# Patient Record
Sex: Female | Born: 1980 | Race: Asian | Hispanic: No | Marital: Married | Smoking: Never smoker
Health system: Southern US, Community
[De-identification: ages and names within clinical notes are randomized; demographics above are authoritative.]

## PROBLEM LIST (undated history)

## (undated) DIAGNOSIS — C50919 Malignant neoplasm of unspecified site of unspecified female breast: Secondary | ICD-10-CM

## (undated) HISTORY — PX: APPENDECTOMY: SHX54

---

## 2013-05-27 ENCOUNTER — Encounter: Payer: Self-pay | Admitting: Emergency Medicine

## 2013-05-27 ENCOUNTER — Emergency Department
Admission: EM | Admit: 2013-05-27 | Discharge: 2013-05-27 | Disposition: A | Discharge status: Home / Self Care | Payer: Self-pay | Source: Home / Self Care | Attending: Family Medicine | Admitting: Family Medicine

## 2013-05-27 DIAGNOSIS — J029 Acute pharyngitis, unspecified: Secondary | ICD-10-CM

## 2013-05-27 LAB — POCT RAPID STREP A (OFFICE): Rapid Strep A Screen: NEGATIVE

## 2013-05-27 MED ORDER — AZITHROMYCIN 250 MG PO TABS: ORAL_TABLET | ORAL | Status: DC

## 2013-05-27 NOTE — ED Notes (Signed)
Pt c/o sore throat and chills x last night. No fever.

## 2013-05-27 NOTE — Discharge Instructions (Signed)
Take plain Mucinex (1200 mg guaifenesin) twice daily for cough and congestion.  May add Sudafed for sinus congestion.   Increase fluid intake, rest. May use Afrin nasal spray (or generic oxymetazoline) twice daily for about 5 days.  Also recommend using saline nasal spray several times daily and saline nasal irrigation (AYR is a common brand) Try warm salt water gargles for sore throat.  May take Delsym Cough Suppressant at bedtime for nighttime cough.  Stop all antihistamines for now, and other non-prescription cough/cold preparations. May take Ibuprofen 200mg , 4 tabs every 8 hours with food for sore throat, headache, etc. Begin Azithromycin if throat culture positive, if not improving about 5 days or if persistent fever develops    Follow-up with family doctor if not improving 7 to 10 days.

## 2013-05-27 NOTE — ED Provider Notes (Signed)
CSN: [id number]     Arrival date & time 05/27/13  1227 History   First MD Initiated Contact with Patient 05/27/13 1339     Chief Complaint  Patient presents with  . Sore Throat      HPI Comments: Patient developed sore throat and chills last night.  The history is provided by the patient.    History reviewed. No pertinent past medical history. Past Surgical History  Procedure Laterality Date  . Appendectomy     Family History  Problem Relation Age of Onset  . Hypertension Mother   . Hypertension Father    History  Substance Use Topics  . Smoking status: Never Smoker   . Smokeless tobacco: Never Used  . Alcohol Use: Yes   OB History   Grav Para Term Preterm Abortions TAB SAB Ect Mult Living                 Review of Systems + sore throat No cough No pleuritic pain No wheezing No nasal congestion ? post-nasal drainage No sinus pain/pressure No itchy/red eyes No earache No hemoptysis No SOB No fever, + chills No nausea No vomiting No abdominal pain No diarrhea No urinary symptoms No skin rash + fatigue + myalgias No headache    Allergies  Review of patient's allergies indicates no known allergies.  Home Medications   Current Outpatient Rx  Name  Route  Sig  Dispense  Refill  . azithromycin (ZITHROMAX Z-PAK) 250 MG tablet      Take 2 tabs today; then begin one tab once daily for 4 more days. (Rx void after 06/04/13)   6 each   0    BP 106/68  Pulse 98  Temp(Src) 98.9 F (37.2 C) (Oral)  Resp 14  Ht 5\' 1"  (1.549 m)  Wt 108 lb (48.988 kg)  BMI 20.42 kg/m2  SpO2 98%  LMP 05/27/2013 Physical Exam Nursing notes and Vital Signs reviewed. Appearance:  Patient appears healthy, stated age, and in no acute distress Eyes:  Pupils are equal, round, and reactive to light and accomodation.  Extraocular movement is intact.  Conjunctivae are not inflamed  Ears:  Canals normal.  Tympanic membranes normal.  Nose:  Mildly congested turbinates.  No sinus  tenderness.  Pharynx:  Minimal erythema Neck:  Supple.   Prominent bilateral non-tender anterior nodes  Lungs:  Clear to auscultation.  Breath sounds are equal.  Heart:  Regular rate and rhythm without murmurs, rubs, or gallops.  Abdomen:  Nontender without masses or hepatosplenomegaly.  Bowel sounds are present.  No CVA or flank tenderness.  Extremities:  No edema.    Skin:  No rash present.   ED Course  Procedures  None    Labs Reviewed  STREP A DNA PROBE  POCT RAPID STREP A (OFFICE) negative         MDM   1. Acute pharyngitis; suspect early viral URI    Throat culture pending.  There is no evidence of bacterial infection today.   Take plain Mucinex (1200 mg guaifenesin) twice daily for cough and congestion.  May add Sudafed for sinus congestion.   Increase fluid intake, rest. May use Afrin nasal spray (or generic oxymetazoline) twice daily for about 5 days.  Also recommend using saline nasal spray several times daily and saline nasal irrigation (AYR is a common brand) Try warm salt water gargles for sore throat.  May take Delsym Cough Suppressant at bedtime for nighttime cough.  Stop all antihistamines for now,  and other non-prescription cough/cold preparations. May take Ibuprofen 200mg , 4 tabs every 8 hours with food for sore throat, headache, etc. Begin Azithromycin if throat culture positive, if not improving about 5 days or if persistent fever develops (Given a prescription to hold, with an expiration date)  Follow-up with family doctor if not improving 7 to 10 days.    Lattie HawStephen A Eliodoro Gullett, MD 05/29/13 1115

## 2013-05-28 LAB — STREP A DNA PROBE: GASP: NEGATIVE

## 2013-06-01 ENCOUNTER — Telehealth: Payer: Self-pay | Admitting: *Deleted

## 2013-08-11 ENCOUNTER — Ambulatory Visit (INDEPENDENT_AMBULATORY_CARE_PROVIDER_SITE_OTHER): Payer: No Typology Code available for payment source | Admitting: Sports Medicine

## 2013-08-11 ENCOUNTER — Encounter: Payer: Self-pay | Admitting: Sports Medicine

## 2013-08-11 VITALS — BP 103/62 | HR 75 | Ht 62.0 in | Wt 109.0 lb

## 2013-08-11 DIAGNOSIS — Z299 Encounter for prophylactic measures, unspecified: Secondary | ICD-10-CM | POA: Insufficient documentation

## 2013-08-11 DIAGNOSIS — Z0000 Encounter for general adult medical examination without abnormal findings: Secondary | ICD-10-CM

## 2013-08-11 DIAGNOSIS — R718 Other abnormality of red blood cells: Secondary | ICD-10-CM

## 2013-08-11 DIAGNOSIS — Z3009 Encounter for other general counseling and advice on contraception: Secondary | ICD-10-CM

## 2013-08-11 NOTE — Progress Notes (Signed)
  Subjective:    CC: Establish care, complete physical.   HPI:  This is a very pleasant 33 year old female, she is healthy, no medical problems and no complaints. She does desire complete physical. She [facility], she desires to repeat, but she would like to be able to choose the sex of the baby. She has selected a reproductive endocrinologist in the [place] area. She is up-to-date on all screening measures, her last Pap smear was in 2014, she has had greater than 3 negative.  Past medical history, Surgical history, Family history not pertinant except as noted below, Social history, Allergies, and medications have been entered into the medical record, reviewed, and no changes needed.   Review of Systems: No headache, visual changes, nausea, vomiting, diarrhea, constipation, dizziness, abdominal pain, skin rash, fevers, chills, night sweats, swollen lymph nodes, weight loss, chest pain, body aches, joint swelling, muscle aches, shortness of breath, mood changes, visual or auditory hallucinations.  Objective:    General: Well Developed, well nourished, and in no acute distress.  Neuro: Alert and oriented x3, extra-ocular muscles intact, sensation grossly intact.  HEENT: Normocephalic, atraumatic, pupils equal round reactive to light, neck supple, no masses, no lymphadenopathy, thyroid nonpalpable. Oropharynx, nasopharynx, ear canals are remarkable.  Skin: Warm and dry, no rashes noted.  Cardiac: Regular rate and rhythm, no murmurs rubs or gallops.  Respiratory: Clear to auscultation bilaterally. Not using accessory muscles, speaking in full sentences.  Abdominal: Soft, nontender, nondistended, positive bowel sounds, no masses, no organomegaly.  Musculoskeletal: Shoulder, elbow, wrist, hip, knee, ankle stable, and with full range of motion.  Impression and Recommendations:    The patient was counselled, risk factors were discussed,  anticipatory guidance given.

## 2013-08-11 NOTE — Assessment & Plan Note (Signed)
Patient does desire to be able to choose the sex of her baby, she asks for fertility medicine referral. I did discuss this with our local OB/GYN, there are no doctors in the area that allow this.

## 2013-08-11 NOTE — Assessment & Plan Note (Signed)
Complete physical as above. Pap smear was [facility] in [place]. She has had greater than 3 negatives.

## 2013-08-26 DIAGNOSIS — R718 Other abnormality of red blood cells: Secondary | ICD-10-CM | POA: Insufficient documentation

## 2013-08-26 LAB — COMPREHENSIVE METABOLIC PANEL
ALT: 13 U/L (ref 0–35)
AST: 15 U/L (ref 0–37)
Alkaline Phosphatase: 50 U/L (ref 39–117)
CO2: 25 mEq/L (ref 19–32)
Calcium: 8.9 mg/dL (ref 8.4–10.5)
Creat: 0.53 mg/dL (ref 0.50–1.10)
Total Bilirubin: 0.8 mg/dL (ref 0.2–1.2)

## 2013-08-26 LAB — LIPID PANEL
Cholesterol: 153 mg/dL (ref 0–200)
HDL: 44 mg/dL (ref >39)
LDL Cholesterol: 96 mg/dL (ref 0–99)
Total CHOL/HDL Ratio: 3.5 Ratio
Triglycerides: 64 mg/dL (ref <150)
VLDL: 13 mg/dL (ref 0–40)

## 2013-08-26 LAB — COMPREHENSIVE METABOLIC PANEL WITH GFR
Albumin: 4.1 g/dL (ref 3.5–5.2)
BUN: 13 mg/dL (ref 6–23)
Chloride: 105 meq/L (ref 96–112)
Glucose, Bld: 76 mg/dL (ref 70–99)
Potassium: 3.8 meq/L (ref 3.5–5.3)
Sodium: 139 meq/L (ref 135–145)
Total Protein: 6.5 g/dL (ref 6.0–8.3)

## 2013-08-26 LAB — CBC
HCT: 40.7 % (ref 36.0–46.0)
Hemoglobin: 12.9 g/dL (ref 12.0–15.0)
MCH: 21.7 pg — ABNORMAL LOW (ref 26.0–34.0)
MCHC: 31.7 g/dL (ref 30.0–36.0)
MCV: 68.5 fL — ABNORMAL LOW (ref 78.0–100.0)
Platelets: 216 10*3/uL (ref 150–400)
RBC: 5.94 MIL/uL — ABNORMAL HIGH (ref 3.87–5.11)
RDW: 16.6 % — ABNORMAL HIGH (ref 11.5–15.5)
WBC: 4.2 K/uL (ref 4.0–10.5)

## 2013-08-26 LAB — TSH: TSH: 2.036 u[IU]/mL (ref 0.350–4.500)

## 2013-08-26 LAB — HEMOGLOBIN A1C
Hgb A1c MFr Bld: 5.7 % — ABNORMAL HIGH (ref <5.7)
Mean Plasma Glucose: 117 mg/dL — ABNORMAL HIGH (ref <117)

## 2013-08-26 NOTE — Assessment & Plan Note (Signed)
Microcytosis in an [place] female. This likely represents a thalassemia, checking hemoglobin electrophoresis and an anemia panel.

## 2013-08-26 NOTE — Addendum Note (Signed)
Addended by: Monica BectonHEKKEKANDAM, Treena Cosman J on: 08/26/2013 08:43 AM   Modules accepted: Orders

## 2013-08-29 LAB — IRON AND TIBC
%SAT: 41 % (ref 20–55)
Iron: 150 ug/dL — ABNORMAL HIGH (ref 42–145)
TIBC: 370 ug/dL (ref 250–470)
UIBC: 220 ug/dL (ref 125–400)

## 2013-08-29 LAB — FOLATE: Folate: >20 ng/mL

## 2013-08-29 LAB — VITAMIN B12: Vitamin B-12: 608 pg/mL (ref 211–911)

## 2013-08-29 LAB — FERRITIN: Ferritin: 10 ng/mL (ref 10–291)

## 2013-08-31 LAB — HEMOGLOBINOPATHY EVALUATION
Hemoglobin Other: 0 %
Hgb A2 Quant: 2.2 % (ref 2.2–3.2)
Hgb A: 97.8 % (ref 96.8–97.8)
Hgb F Quant: 0 % (ref 0.0–2.0)
Hgb S Quant: 0 %

## 2013-10-11 ENCOUNTER — Telehealth: Payer: Self-pay

## 2013-10-11 DIAGNOSIS — M26629 Arthralgia of temporomandibular joint, unspecified side: Secondary | ICD-10-CM

## 2013-10-11 NOTE — Telephone Encounter (Signed)
Referral placed.

## 2013-10-11 NOTE — Telephone Encounter (Signed)
Patient has been informed. Jarelly Rinck,CMA  

## 2013-10-11 NOTE — Telephone Encounter (Signed)
Patient called stated that she needs a referral to see the oral surgeon for her jaw. Rhonda Cunningham,CMA

## 2015-02-07 ENCOUNTER — Ambulatory Visit (INDEPENDENT_AMBULATORY_CARE_PROVIDER_SITE_OTHER): Payer: No Typology Code available for payment source | Admitting: Physician Assistant

## 2015-02-07 ENCOUNTER — Encounter: Payer: Self-pay | Admitting: Physician Assistant

## 2015-02-07 VITALS — BP 120/69 | HR 70 | Ht 62.0 in | Wt 107.0 lb

## 2015-02-07 DIAGNOSIS — K2970 Gastritis, unspecified, without bleeding: Secondary | ICD-10-CM

## 2015-02-07 DIAGNOSIS — K2990 Gastroduodenitis, unspecified, without bleeding: Secondary | ICD-10-CM

## 2015-02-07 MED ORDER — RANITIDINE HCL 150 MG PO TABS: 150.0000 mg | ORAL_TABLET | Freq: Two times a day (BID) | ORAL | Status: DC

## 2015-02-07 MED ORDER — GI COCKTAIL ~~LOC~~
30.0000 mL | Freq: Once | ORAL | Status: AC
  Administered 2015-02-07: 30 mL via ORAL

## 2015-02-07 NOTE — Patient Instructions (Signed)

## 2015-02-07 NOTE — Progress Notes (Signed)
Alexis [name] is a 34 y.o. female who presents to [facility] Primary Care today for Epigastric pain. Pain started on Monday and is located in the medial epigastric region. Pain is persistent throughout the day, and is affecting her sleep at night. She describes the pain as "pressure" and feeling like "there's a lot of gas in there", but not sharp in nature. Pain radiates to chest, and she is not experiencing symptoms in any other location. She rates pain as 3/10. Epigastric pain was preceded by headache Sunday night, and a singular episode of diarrhea on Monday. She reports the headache resolved that she returned to regular bowel habits. She has taken ibuprofen and tums with no relief, there are no palliative factors. She denies fever, chills, myalgias, chest pain, cough, and sob.   No past medical history on file. Past Surgical History  Procedure Laterality Date  . Appendectomy     Social History  Substance Use Topics  . Smoking status: Never Smoker   . Smokeless tobacco: Never Used  . Alcohol Use: Yes   family history includes Hypertension in her father and mother.  ROS as above Medications: OTC Ibuprofen and tums prn  No current outpatient prescriptions on file.   No current facility-administered medications for this visit.   No Known Allergies   Exam:  Vitals: Blood pressure 120/69, pulse 70, height  (1.575 m), weight 107 lb (48.535 kg). Gen: Well NAD HEENT: EOMI,  MMM Lungs: Normal work of breathing. CTABL Heart: RRR no MRG Abd: NABS, Soft. Nondistended, Nontender   Assessment and Plan:  Gastritis/Epigastric pain: Gave patient GI cocktail in office today. Rx 150 mg ranitidine qd, encouraged patient to try for 7 days and see if symptoms improve. Patient instructed to call if symptoms worsen or do not improve after 7 days. GERD diet discussed. Stay away from NSAIDs.   Reviewed and changes made by Tandy Gaw PA-C.

## 2015-03-09 ENCOUNTER — Encounter: Payer: Self-pay | Admitting: Family Medicine

## 2015-03-09 ENCOUNTER — Ambulatory Visit (INDEPENDENT_AMBULATORY_CARE_PROVIDER_SITE_OTHER): Payer: Self-pay | Admitting: Family Medicine

## 2015-03-09 VITALS — BP 100/69 | HR 73 | Temp 98.4°F | Resp 16 | Wt 108.3 lb

## 2015-03-09 DIAGNOSIS — J069 Acute upper respiratory infection, unspecified: Secondary | ICD-10-CM

## 2015-03-09 MED ORDER — BENZONATATE 200 MG PO CAPS: 200.0000 mg | ORAL_CAPSULE | Freq: Three times a day (TID) | ORAL | Status: DC | PRN

## 2015-03-09 NOTE — Patient Instructions (Signed)

## 2015-03-09 NOTE — Progress Notes (Addendum)
   Subjective:    Patient ID: Alexis [name], female    DOB: 01-22-1981, 34 y.o.   MRN: [id number]  HPI Sore throat and cough 2 days. She's been taking Mucinex for relief, but not really helping. No fever, chills or sweats.  She feels like her throat is a little bit swollen. No sinus symptoms such as congestion. Her daughter has been sick as well.   Review of Systems     Objective:   Physical Exam  Constitutional: She is oriented to person, place, and time. She appears well-developed and well-nourished.  HENT:  Head: Normocephalic and atraumatic.  Right Ear: External ear normal.  Left Ear: External ear normal.  Nose: Nose normal.  Mouth/Throat: Oropharynx is clear and moist.  TMs and canals are clear.   Eyes: Conjunctivae and EOM are normal. Pupils are equal, round, and reactive to light.  Neck: Neck supple. No thyromegaly present.  Cardiovascular: Normal rate, regular rhythm and normal heart sounds.   Pulmonary/Chest: Effort normal and breath sounds normal. She has no wheezes.  Lymphadenopathy:    She has no cervical adenopathy.  Neurological: She is alert and oriented to person, place, and time.  Skin: Skin is warm and dry.  Psychiatric: She has a normal mood and affect.          Assessment & Plan:  Upper respiratory infection-most likely viral.  Recommend symptomatic care. [facility]. Recommend over-the-counter zinc lozenges. Call back if not better in one week or if getting worse please call back sooner.

## 2015-03-12 ENCOUNTER — Telehealth: Payer: Self-pay

## 2015-03-12 NOTE — Telephone Encounter (Signed)
Patient called and states the cough medication is not helping and she would like something else for the cough. Please advise.

## 2015-03-12 NOTE — Telephone Encounter (Signed)
OK to print script.  Med entered. She will have to pick it up. If more SOB or getting worse then can get CXR.

## 2015-03-13 MED ORDER — HYDROCODONE-HOMATROPINE 5-1.5 MG/5ML PO SYRP: 5.0000 mL | ORAL_SOLUTION | Freq: Every evening | ORAL | Status: DC | PRN

## 2015-03-13 NOTE — Telephone Encounter (Signed)
Patient advised.

## 2015-04-04 ENCOUNTER — Ambulatory Visit (INDEPENDENT_AMBULATORY_CARE_PROVIDER_SITE_OTHER): Payer: Self-pay | Admitting: Sports Medicine

## 2015-04-04 ENCOUNTER — Other Ambulatory Visit: Payer: Self-pay | Admitting: Sports Medicine

## 2015-04-04 ENCOUNTER — Ambulatory Visit (INDEPENDENT_AMBULATORY_CARE_PROVIDER_SITE_OTHER): Payer: Self-pay

## 2015-04-04 VITALS — BP 97/80 | HR 98 | Temp 98.0°F | Resp 18 | Wt 108.7 lb

## 2015-04-04 DIAGNOSIS — R05 Cough: Secondary | ICD-10-CM

## 2015-04-04 DIAGNOSIS — R053 Chronic cough: Secondary | ICD-10-CM | POA: Insufficient documentation

## 2015-04-04 LAB — CBC WITH DIFFERENTIAL/PLATELET
Basophils Absolute: 0 K/uL (ref 0.0–0.1)
Basophils Relative: 0 % (ref 0–1)
Eosinophils Absolute: 0.1 10*3/uL (ref 0.0–0.7)
Eosinophils Relative: 2 % (ref 0–5)
HCT: 37.6 % (ref 36.0–46.0)
Hemoglobin: 12.3 g/dL (ref 12.0–15.0)
Lymphocytes Relative: 14 % (ref 12–46)
Lymphs Abs: 1 10*3/uL (ref 0.7–4.0)
MCH: 22 pg — ABNORMAL LOW (ref 26.0–34.0)
MCHC: 32.7 g/dL (ref 30.0–36.0)
MCV: 67.3 fL — ABNORMAL LOW (ref 78.0–100.0)
Monocytes Absolute: 0.5 K/uL (ref 0.1–1.0)
Monocytes Relative: 7 % (ref 3–12)
Neutro Abs: 5.6 K/uL (ref 1.7–7.7)
Neutrophils Relative %: 77 % (ref 43–77)
Platelets: 206 10*3/uL (ref 150–400)
RBC: 5.59 MIL/uL — ABNORMAL HIGH (ref 3.87–5.11)
RDW: 15.3 % (ref 11.5–15.5)
WBC: 7.3 K/uL (ref 4.0–10.5)

## 2015-04-04 LAB — COMPREHENSIVE METABOLIC PANEL WITH GFR
AST: 13 U/L (ref 10–30)
Albumin: 4.2 g/dL (ref 3.6–5.1)
CO2: 24 mmol/L (ref 20–31)
Creat: 0.51 mg/dL (ref 0.50–1.10)
Sodium: 135 mmol/L (ref 135–146)

## 2015-04-04 LAB — D-DIMER, QUANTITATIVE: D-Dimer, Quant: 0.27 ug{FEU}/mL (ref 0.00–0.48)

## 2015-04-04 LAB — COMPREHENSIVE METABOLIC PANEL
ALT: 10 U/L (ref 6–29)
Alkaline Phosphatase: 61 U/L (ref 33–115)
BUN: 11 mg/dL (ref 7–25)
Calcium: 9.1 mg/dL (ref 8.6–10.2)
Chloride: 102 mmol/L (ref 98–110)
Glucose, Bld: 80 mg/dL (ref 65–99)
Potassium: 3.8 mmol/L (ref 3.5–5.3)
Total Bilirubin: 0.7 mg/dL (ref 0.2–1.2)
Total Protein: 6.6 g/dL (ref 6.1–8.1)

## 2015-04-04 MED ORDER — AZITHROMYCIN 250 MG PO TABS: ORAL_TABLET | ORAL | Status: DC

## 2015-04-04 MED ORDER — HYDROCOD POLST-CPM POLST ER 10-8 MG/5ML PO SUER: 5.0000 mL | Freq: Two times a day (BID) | ORAL | Status: DC | PRN

## 2015-04-04 NOTE — Progress Notes (Signed)
  Subjective:    CC: Persistent cough  HPI: This is a pleasant 34 year old female, for a month and a half now she's had a persistent cough, after exposure to illness from her child, cough is productive of sputum, nonbloody, she denies any constitutional symptoms, no weight loss, fevers, chills, night sweats. No travel outside of the country, no GI symptoms, no rash. No shortness of breath.  Past medical history, Surgical history, Family history not pertinant except as noted below, Social history, Allergies, and medications have been entered into the medical record, reviewed, and no changes needed.   Review of Systems: No fevers, chills, night sweats, weight loss, chest pain, or shortness of breath.   Objective:    General: Well Developed, well nourished, and in no acute distress.  Neuro: Alert and oriented x3, extra-ocular muscles intact, sensation grossly intact.  HEENT: Normocephalic, atraumatic, pupils equal round reactive to light, neck supple, no masses, no lymphadenopathy, thyroid nonpalpable. Oropharynx, nasopharynx, ear canals unremarkable. Skin: Warm and dry, no rashes. Cardiac: Regular rate and rhythm, no murmurs rubs or gallops, no lower extremity edema.  Respiratory: Clear to auscultation bilaterally. Not using accessory muscles, speaking in full sentences.  Chest x-ray is unremarkable.  Impression and Recommendations:

## 2015-04-04 NOTE — Assessment & Plan Note (Signed)
Adding antibiotics, Tussionex, chest x-ray, and blood test for pertussis and tuberculosis. Return to see me in one week.

## 2015-04-05 LAB — ANGIOTENSIN CONVERTING ENZYME: Angiotensin-Converting Enzyme: 28 U/L (ref 8–52)

## 2015-04-05 LAB — C-ANCA TITER: C-ANCA: 1:40 {titer} — ABNORMAL HIGH

## 2015-04-05 LAB — ANA: Anti Nuclear Antibody(ANA): NEGATIVE

## 2015-04-05 LAB — ANCA SCREEN W REFLEX TITER: ANCA Screen: POSITIVE — AB

## 2015-04-06 LAB — QUANTIFERON TB GOLD ASSAY (BLOOD)
Interferon Gamma Release Assay: NEGATIVE
Mitogen value: >10 [IU]/mL
Quantiferon Nil Value: 0.05 [IU]/mL
Quantiferon Tb Ag Minus Nil Value: 0 IU/mL
TB Ag value: 0.05 IU/mL

## 2015-04-10 LAB — BORDETELLA PERTUSSIS AB IGG,IGA
FHA IgA: 13 IU/mL
FHA IgG: 62 IU/mL
PT IgA: <1 IU/mL
PT IgG: 2 IU/mL

## 2015-04-18 ENCOUNTER — Ambulatory Visit: Payer: Self-pay | Admitting: Sports Medicine

## 2015-04-19 ENCOUNTER — Ambulatory Visit (INDEPENDENT_AMBULATORY_CARE_PROVIDER_SITE_OTHER): Payer: Self-pay | Admitting: Sports Medicine

## 2015-04-19 ENCOUNTER — Encounter: Payer: Self-pay | Admitting: Sports Medicine

## 2015-04-19 VITALS — BP 100/51 | HR 75 | Wt 108.0 lb

## 2015-04-19 DIAGNOSIS — R05 Cough: Secondary | ICD-10-CM

## 2015-04-19 DIAGNOSIS — R718 Other abnormality of red blood cells: Secondary | ICD-10-CM

## 2015-04-19 DIAGNOSIS — R053 Chronic cough: Secondary | ICD-10-CM

## 2015-04-19 DIAGNOSIS — Z23 Encounter for immunization: Secondary | ICD-10-CM

## 2015-04-19 MED ORDER — FERROUS SULFATE 325 (65 FE) MG PO TBEC: 325.0000 mg | DELAYED_RELEASE_TABLET | Freq: Two times a day (BID) | ORAL | Status: DC

## 2015-04-19 NOTE — Assessment & Plan Note (Signed)
Cough continues to improve. At this point she is happy with where things are, discontinue Tussionex. Imaging and serum workup were negative and antibiotics seemed to improve her symptoms sufficiently.

## 2015-04-19 NOTE — Progress Notes (Signed)
  Subjective:    CC: Cough  HPI: Patient returns after visit 2 weeks ago for a cough of 6 weeks duration at that time. She is doing better after taking the antibiotic and anti-tussive medications. She says that the cough has decreased in frequency and she is no longer producing any green phlegm. She thinks the cough is worse at night because that is when her family complains the most about it. She says that the cough is mainly a nuisance because it keeps her family up at night.  She reports that she usually gets a cough that lasts 1-2 months after URI and that the only thing that made this episode different was increased phlegm production. She denies hemoptysis, SOB, chest pain, weight loss, chills, or fevers. She reports feeling "great" otherwise.  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  Patient also complains of "dizziness". She says she feels like she may vomit, mainly when she is in a car. She denies feeling like the room is spinning or like she may pass out. This has been going on for years. She asks if this is related to her anemia.  Past medical history, Surgical history, Family history not pertinant except as noted below, Social history, Allergies, and medications have been entered into the medical record, reviewed, and no changes needed.   Review of Systems: No fevers, chills, night sweats, weight loss, chest pain, or shortness of breath.   Objective:    General: Well Developed, well nourished, and in no acute distress.  Neuro: Alert and oriented x3, extra-ocular muscles intact, sensation grossly intact.  HEENT: Normocephalic, atraumatic, pupils equal round reactive to light, neck supple, no masses, no lymphadenopathy, thyroid nonpalpable.  Skin: Warm and dry, no rashes. Cardiac: Regular rate and rhythm, no murmurs rubs or gallops, no lower extremity edema.  Respiratory: Clear to auscultation  bilaterally. Not using accessory muscles, speaking in full sentences.   Impression and Recommendations:    Patient had an extensive work up for chronic cough that came back normal (TB, ANA, d-dimer, CBC, BMP, Bordatella, and angiotensin converting enzyme). Patient did have a slightly elevated c-ANCA, but the mild elevation in conjunction with lack of hemoptysis and systemic symptoms make an autoimmune disease unlikely at this time. As the patient is improving, we will stay the course with anti-tussives for symptom management. Most likely patient had bacterial bronchitis that improved with antibiotic therapy. Patient told to come back if symptoms worsen or fail to improve.  Patient's dizziness could be related to iron deficiency given her past history of a low ferritin and microcytosis. Will give iron PO Rx and plan to recheck labs(anemia panel) upon next visit.

## 2015-04-19 NOTE — Assessment & Plan Note (Signed)
Persistent microcytosis without anemia. Anemia panel was negative over ferritin was in the low limit of normal at 10. Hemoglobin electrophoresis was negative. Adding iron supplementation.

## 2015-07-10 ENCOUNTER — Ambulatory Visit (INDEPENDENT_AMBULATORY_CARE_PROVIDER_SITE_OTHER): Payer: Self-pay | Admitting: Sports Medicine

## 2015-07-10 DIAGNOSIS — Z23 Encounter for immunization: Secondary | ICD-10-CM

## 2015-07-10 NOTE — Progress Notes (Signed)
Pt's BP was low today, looking back at history not far from her trending BP. Pt does report she has not had breakfast today but is not symptomatic.

## 2015-07-23 ENCOUNTER — Ambulatory Visit: Payer: Self-pay | Admitting: Sports Medicine

## 2015-11-09 ENCOUNTER — Emergency Department
Admission: EM | Admit: 2015-11-09 | Discharge: 2015-11-09 | Disposition: A | Discharge status: Home / Self Care | Payer: Self-pay | Source: Home / Self Care

## 2015-11-09 ENCOUNTER — Ambulatory Visit (INDEPENDENT_AMBULATORY_CARE_PROVIDER_SITE_OTHER): Payer: Self-pay | Admitting: Family Medicine

## 2015-11-09 ENCOUNTER — Encounter: Payer: Self-pay | Admitting: Family Medicine

## 2015-11-09 ENCOUNTER — Encounter: Payer: Self-pay | Admitting: Emergency Medicine

## 2015-11-09 DIAGNOSIS — Z0289 Encounter for other administrative examinations: Secondary | ICD-10-CM

## 2015-11-09 DIAGNOSIS — R05 Cough: Secondary | ICD-10-CM

## 2015-11-09 DIAGNOSIS — R053 Chronic cough: Secondary | ICD-10-CM

## 2015-11-09 MED ORDER — TRIAMCINOLONE ACETONIDE 40 MG/ML IJ SUSP
40.0000 mg | Freq: Once | INTRAMUSCULAR | Status: AC
  Administered 2015-11-09: 40 mg via INTRAMUSCULAR

## 2015-11-09 NOTE — ED Provider Notes (Signed)
CSN: [id number]     Arrival date & time 11/09/15  1130 History   None    Chief Complaint  Patient presents with  . Cough     HPI Comments: Patient reports that three weeks ago she developed typical cold-like symptoms developing over several days,  including mild sore throat, sinus congestion, headache, fatigue, chills, and cough.  She now feels well but cough persists.  No fevers, chills, and sweats recently.  She states that every time she gets a cold, he cough lingers. Her family history includes asthma in her paternal grandfather.  She states that her father has a similar problem with viral URI's.  The history is provided by the patient.    History reviewed. No pertinent past medical history. Past Surgical History  Procedure Laterality Date  . Appendectomy     Family History  Problem Relation Age of Onset  . Hypertension Mother   . Hypertension Father    Social History  Substance Use Topics  . Smoking status: Never Smoker   . Smokeless tobacco: Never Used  . Alcohol Use: Yes   OB History    No data available     Review of Systems + sore throat + cough No pleuritic pain No wheezing No nasal congestion ? post-nasal drainage No sinus pain/pressure No itchy/red eyes No earache No hemoptysis No SOB No fever/chills No nausea No vomiting No abdominal pain No diarrhea No urinary symptoms No skin rash No fatigue No myalgias No headache Used OTC meds without relief  Allergies  Review of patient's allergies indicates no known allergies.  Home Medications   Prior to Admission medications   Medication Sig Start Date End Date Taking? Authorizing Provider  ferrous sulfate 325 (65 FE) MG EC tablet Take 1 tablet (325 mg total) by mouth 2 (two) times daily with a meal. 04/19/15   Monica Bectonhomas J Thekkekandam, MD   Meds Ordered and Administered this Visit   Medications  triamcinolone acetonide (KENALOG-40) injection 40 mg (not administered)    BP 103/67 mmHg  Pulse 81   Temp(Src) 98.2 F (36.8 C) (Oral)  Wt 111 lb (50.349 kg)  SpO2 98% No data found.   Physical Exam Nursing notes and Vital Signs reviewed. Appearance:  Patient appears stated age, and in no acute distress Eyes:  Pupils are equal, round, and reactive to light and accomodation.  Extraocular movement is intact.  Conjunctivae are not inflamed  Ears:  Canals normal.  Tympanic membranes normal.  Nose:  Mildly congested turbinates.  No sinus tenderness.    Pharynx:  Normal Neck:  Supple.  Tender enlarged posterior/lateral nodes are palpated bilaterally  Lungs:  Clear to auscultation.  Breath sounds are equal.  Moving air well. Heart:  Regular rate and rhythm without murmurs, rubs, or gallops.  Abdomen:  Nontender without masses or hepatosplenomegaly.  Bowel sounds are present.  No CVA or flank tenderness.  Extremities:  No edema.  Skin:  No rash present.   ED Course  Procedures none    MDM   1. Persistent cough for 3 weeks or longer; suspect viral post-infectious cough with bronchospasm (note family history of asthma)   There is no evidence of bacterial infection today.   Administered Kenalog 40mg  IM May take Delsym Cough Suppressant at bedtime for nighttime cough.     Lattie HawStephen A Beese, MD 11/09/15 [phone]

## 2015-11-09 NOTE — Progress Notes (Signed)
No show same day appointment 11/09/2015

## 2015-11-09 NOTE — ED Notes (Signed)
Pt c/o non productive cough x3 weeks.

## 2015-11-09 NOTE — Discharge Instructions (Signed)
May take Delsym Cough Suppressant at bedtime for nighttime cough.  

## 2020-04-17 ENCOUNTER — Other Ambulatory Visit: Payer: Self-pay | Admitting: Internal Medicine

## 2020-04-17 DIAGNOSIS — Z1231 Encounter for screening mammogram for malignant neoplasm of breast: Secondary | ICD-10-CM

## 2020-04-23 ENCOUNTER — Other Ambulatory Visit: Payer: Self-pay | Admitting: Obstetrics and Gynecology

## 2020-04-23 DIAGNOSIS — N6452 Nipple discharge: Secondary | ICD-10-CM

## 2020-05-30 ENCOUNTER — Ambulatory Visit: Payer: Self-pay

## 2020-05-31 ENCOUNTER — Inpatient Hospital Stay: Admission: RE | Admit: 2020-05-31 | Payer: Self-pay | Source: Ambulatory Visit

## 2021-01-16 ENCOUNTER — Other Ambulatory Visit: Payer: Self-pay | Admitting: Obstetrics and Gynecology

## 2021-01-16 DIAGNOSIS — N6452 Nipple discharge: Secondary | ICD-10-CM

## 2021-02-27 ENCOUNTER — Other Ambulatory Visit: Payer: Self-pay

## 2021-02-28 ENCOUNTER — Ambulatory Visit
Admission: RE | Admit: 2021-02-28 | Discharge: 2021-02-28 | Disposition: A | Discharge status: Home / Self Care | Payer: BC Managed Care – PPO | Source: Ambulatory Visit | Attending: Obstetrics and Gynecology | Admitting: Obstetrics and Gynecology

## 2021-02-28 ENCOUNTER — Other Ambulatory Visit: Payer: Self-pay

## 2021-02-28 ENCOUNTER — Ambulatory Visit
Admission: RE | Admit: 2021-02-28 | Discharge: 2021-02-28 | Disposition: A | Discharge status: Home / Self Care | Payer: Self-pay | Source: Ambulatory Visit | Attending: Obstetrics and Gynecology | Admitting: Obstetrics and Gynecology

## 2021-02-28 DIAGNOSIS — N6452 Nipple discharge: Secondary | ICD-10-CM

## 2021-03-19 ENCOUNTER — Other Ambulatory Visit: Payer: Self-pay | Admitting: Surgery

## 2021-03-19 ENCOUNTER — Other Ambulatory Visit (HOSPITAL_COMMUNITY): Payer: Self-pay | Admitting: Surgery

## 2021-03-19 DIAGNOSIS — N6452 Nipple discharge: Secondary | ICD-10-CM

## 2021-05-22 ENCOUNTER — Encounter (HOSPITAL_COMMUNITY): Payer: Self-pay

## 2021-05-22 ENCOUNTER — Other Ambulatory Visit: Payer: Self-pay

## 2021-05-22 ENCOUNTER — Ambulatory Visit (HOSPITAL_COMMUNITY)
Admission: RE | Admit: 2021-05-22 | Discharge: 2021-05-22 | Disposition: A | Discharge status: Home / Self Care | Payer: BC Managed Care – PPO | Source: Ambulatory Visit | Attending: Surgery | Admitting: Surgery

## 2021-05-22 DIAGNOSIS — N6452 Nipple discharge: Secondary | ICD-10-CM

## 2021-11-05 ENCOUNTER — Other Ambulatory Visit: Payer: Self-pay | Admitting: Surgery

## 2021-11-05 DIAGNOSIS — Z09 Encounter for follow-up examination after completed treatment for conditions other than malignant neoplasm: Secondary | ICD-10-CM

## 2021-11-27 ENCOUNTER — Other Ambulatory Visit: Payer: Self-pay | Admitting: Surgery

## 2021-11-27 DIAGNOSIS — N6452 Nipple discharge: Secondary | ICD-10-CM

## 2021-11-29 IMAGING — MG MM DIGITAL DIAGNOSTIC UNILAT*L* W/ TOMO W/ CAD
8 series · 8 of 24 positions shown · non-contrast
Comparison: Previous exam(s).
COMPARISON: Previous exam(s).

Addendum:
CLINICAL DATA: 40-year-old female presenting for evaluation of
bloody left nipple discharge. Patient states she has had 2 episodes
of spontaneous bloody left nipple discharge, 1 of which was
approximately 1 year ago.

EXAM:
DIGITAL DIAGNOSTIC UNILATERAL LEFT MAMMOGRAM WITH TOMOSYNTHESIS AND
CAD; ULTRASOUND LEFT BREAST LIMITED
TECHNIQUE: Left digital diagnostic mammography and breast tomosynthesis was
performed. The images were evaluated with computer-aided detection.;
Targeted ultrasound examination of the left breast was performed.

[L MLO synth-2D (1 of 3)]
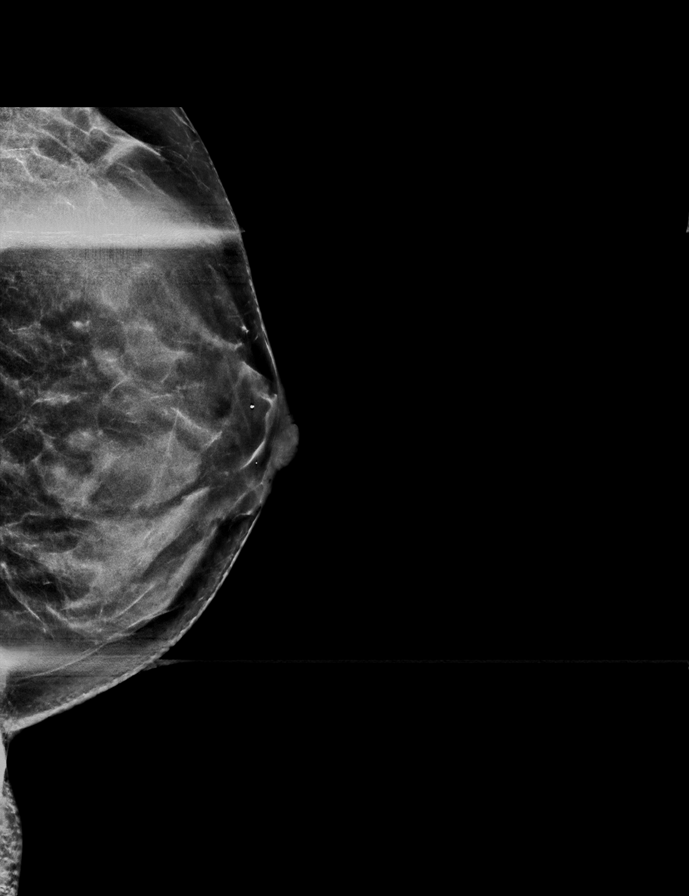

[L MLO synth-2D (2 of 3)]
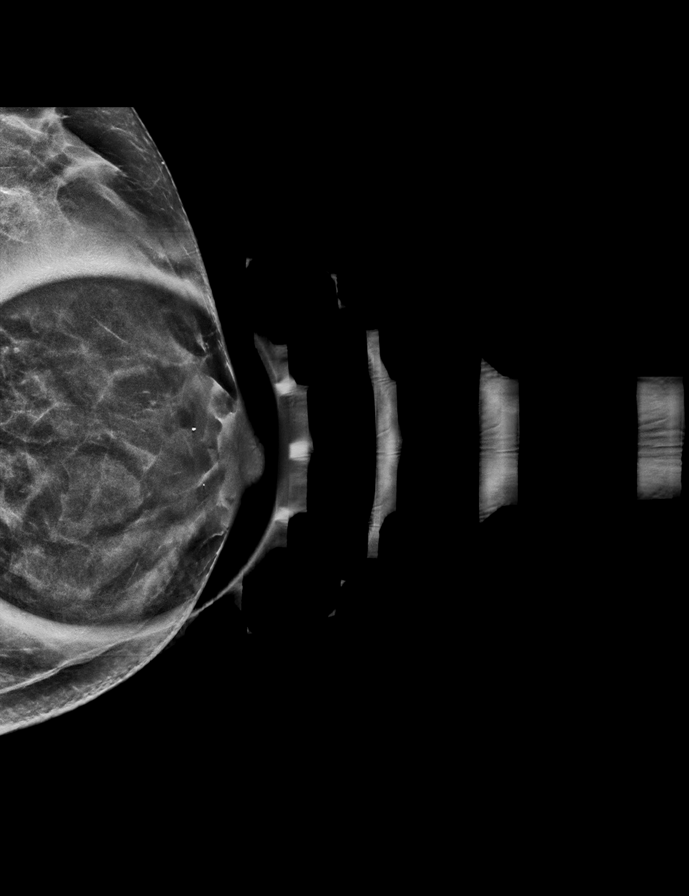

[L CC synth-2D]
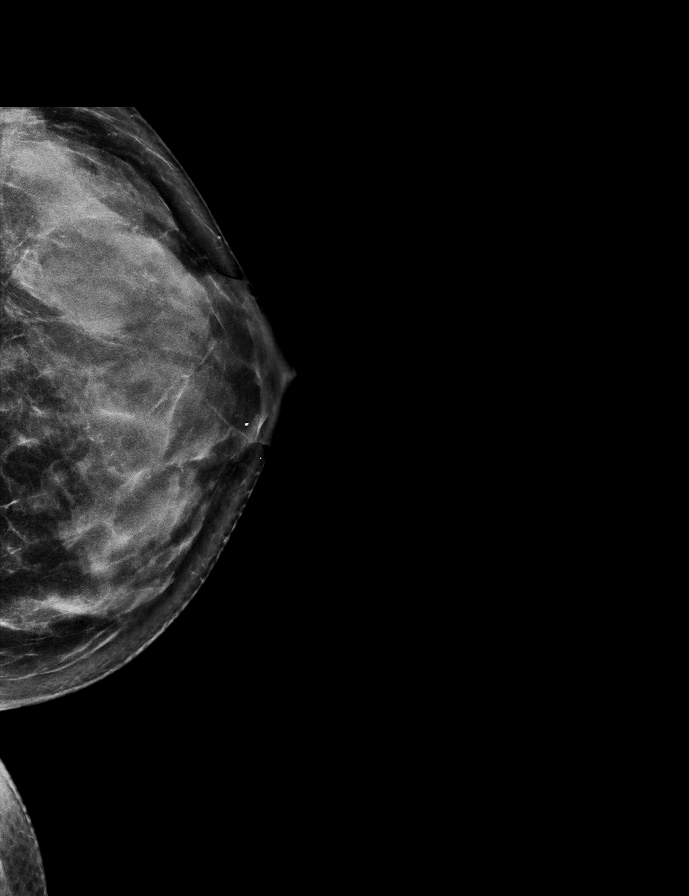

[L MLO synth-2D (3 of 3)]
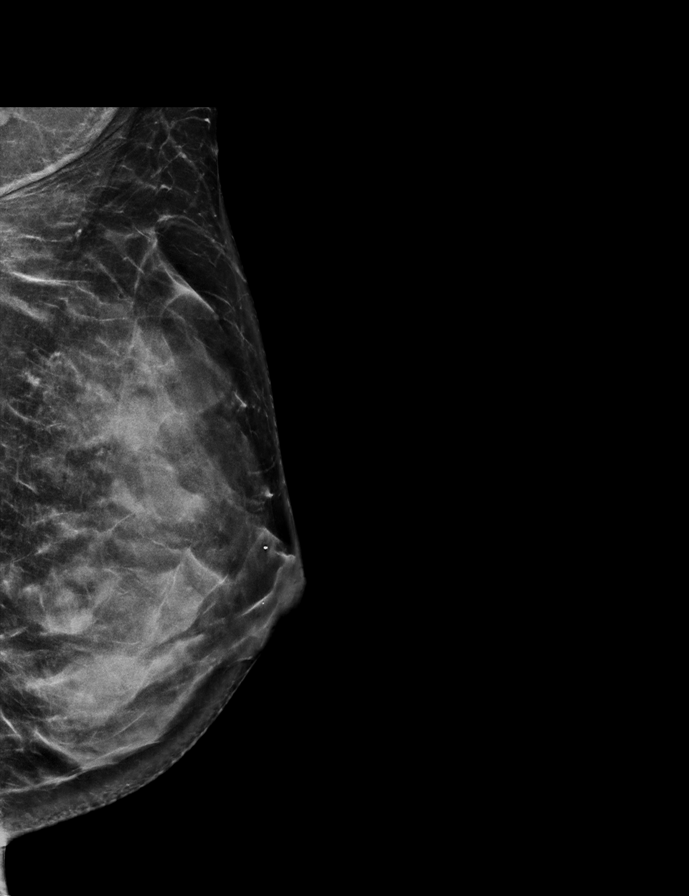

[L CC tomo · tomo slice 34/67.0]
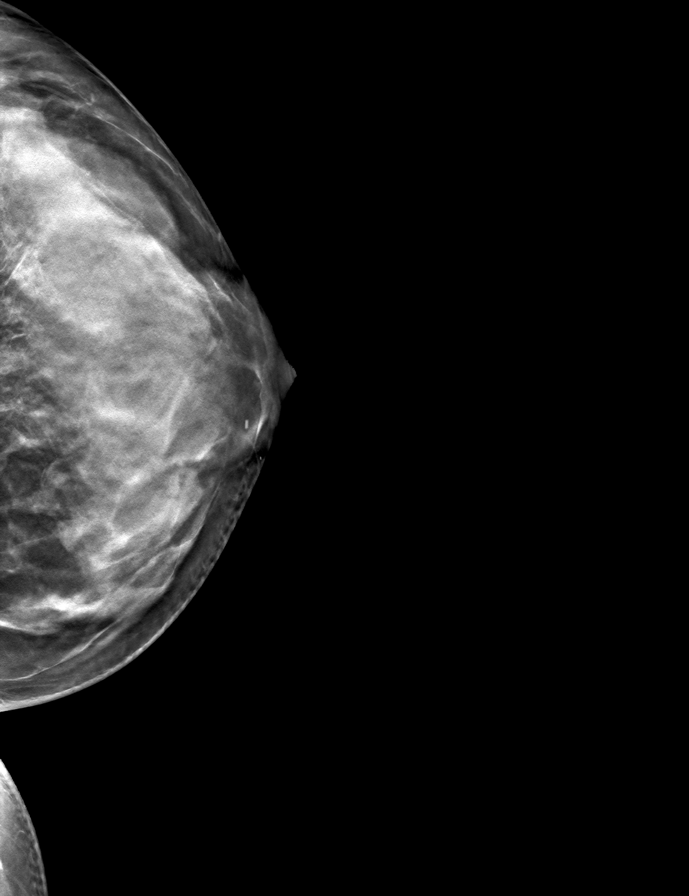

[L MLO tomo (1 of 3) · tomo slice 29/57.0]
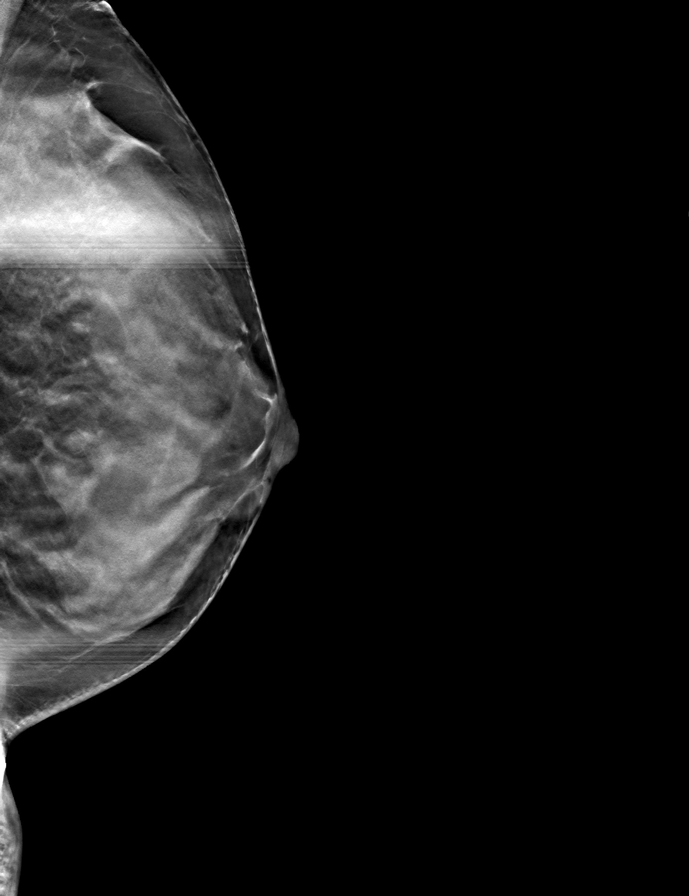

[L MLO tomo (2 of 3) · tomo slice 35/69.0]
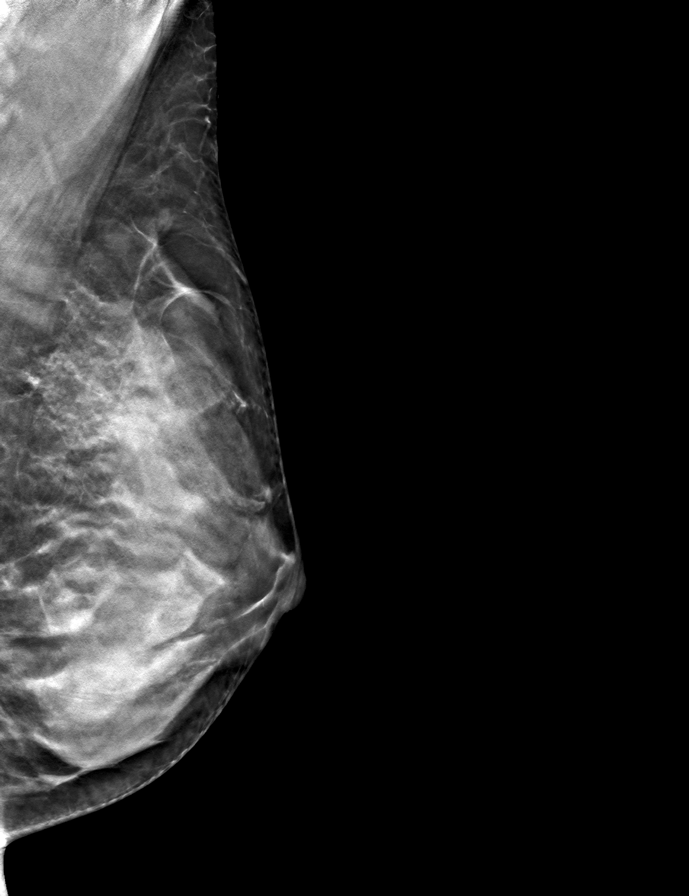

[L MLO tomo (3 of 3) · tomo slice 28/55.0]
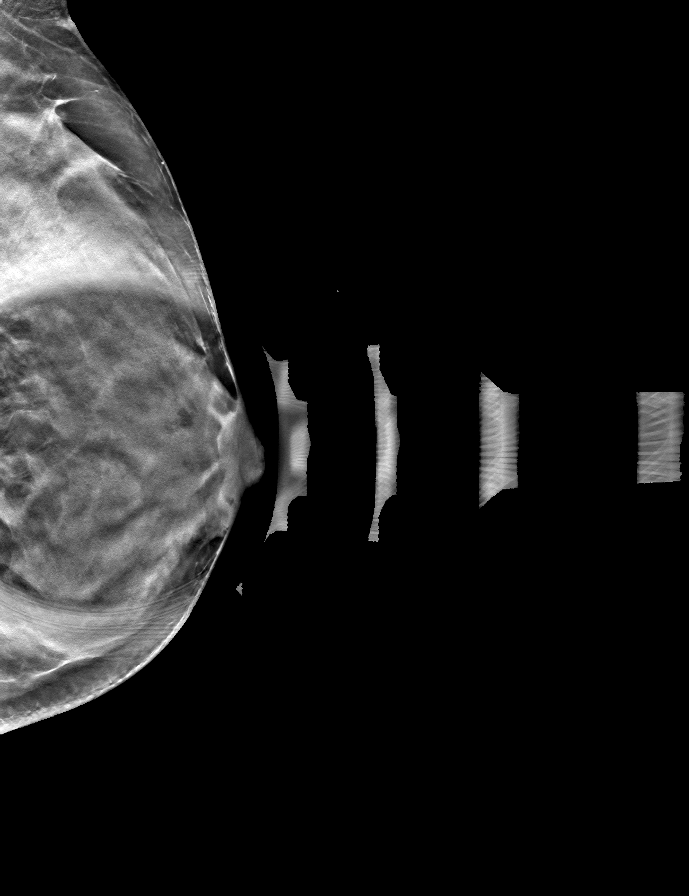

[8 of 24 positions shown; findings below may reference images not displayed]

ACR Breast Density Category d: The breast tissue is extremely dense,
which lowers the sensitivity of mammography.
FINDINGS: Mammogram:

Full field tomosynthesis and spot compression retroareolar views of
the left breast were performed. There is no definite new abnormality
in the retroareolar left breast or elsewhere to suggest the presence
of malignancy.

On physical exam the patient is unable to express discharge from the
left nipple. There are no obvious skin changes on the nipple areolar
complex.

Ultrasound:

Targeted ultrasound performed in the retroareolar aspect of the left
breast demonstrating no cystic or solid mass. No dilated duct or
fluid collection.
IMPRESSION: No mammographic or sonographic evidence of malignancy or other
abnormality in the retroareolar left breast to explain the patient's
bloody nipple discharge.

RECOMMENDATION:
1.  Bilateral breast MRI.

2.  Surgical consultation.

I have discussed the findings and recommendations with the patient.
If applicable, a reminder letter will be sent to the patient
regarding the next appointment.

BI-RADS CATEGORY  1: Negative.

ADDENDUM:
Surgical consultation has been arranged with Dr. Manzenza Fumuassuca at
[REDACTED] on March 15, 2021.

Shalley Jim RN on 03/01/2021

*** End of Addendum ***
ACR Breast Density Category d: The breast tissue is extremely dense,
which lowers the sensitivity of mammography.
FINDINGS: Mammogram:

Full field tomosynthesis and spot compression retroareolar views of
the left breast were performed. There is no definite new abnormality
in the retroareolar left breast or elsewhere to suggest the presence
of malignancy.

On physical exam the patient is unable to express discharge from the
left nipple. There are no obvious skin changes on the nipple areolar
complex.

Ultrasound:

Targeted ultrasound performed in the retroareolar aspect of the left
breast demonstrating no cystic or solid mass. No dilated duct or
fluid collection.
IMPRESSION: No mammographic or sonographic evidence of malignancy or other
abnormality in the retroareolar left breast to explain the patient's
bloody nipple discharge.

RECOMMENDATION:
1.  Bilateral breast MRI.

2.  Surgical consultation.

I have discussed the findings and recommendations with the patient.
If applicable, a reminder letter will be sent to the patient
regarding the next appointment.

BI-RADS CATEGORY  1: Negative.

## 2021-11-29 IMAGING — US US BREAST*L* LIMITED INC AXILLA
1 series · 5 of 5 positions shown · non-contrast
Comparison: Previous exam(s).
COMPARISON: Previous exam(s).

Addendum:
CLINICAL DATA: 40-year-old female presenting for evaluation of
bloody left nipple discharge. Patient states she has had 2 episodes
of spontaneous bloody left nipple discharge, 1 of which was
approximately 1 year ago.

EXAM:
DIGITAL DIAGNOSTIC UNILATERAL LEFT MAMMOGRAM WITH TOMOSYNTHESIS AND
CAD; ULTRASOUND LEFT BREAST LIMITED
TECHNIQUE: Left digital diagnostic mammography and breast tomosynthesis was
performed. The images were evaluated with computer-aided detection.;
Targeted ultrasound examination of the left breast was performed.

[Series 1: us breast*left* limited inc axilla · 0.06mm/px · 5 of 5 slices shown]
[im 1/5]
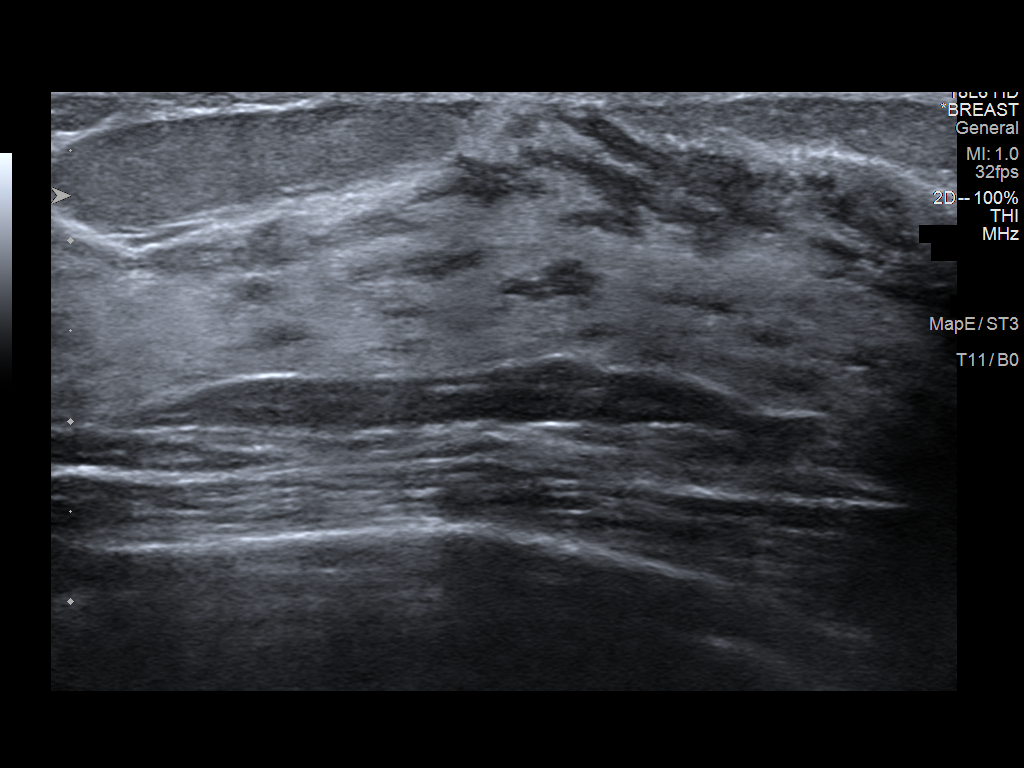
[im 2/5]
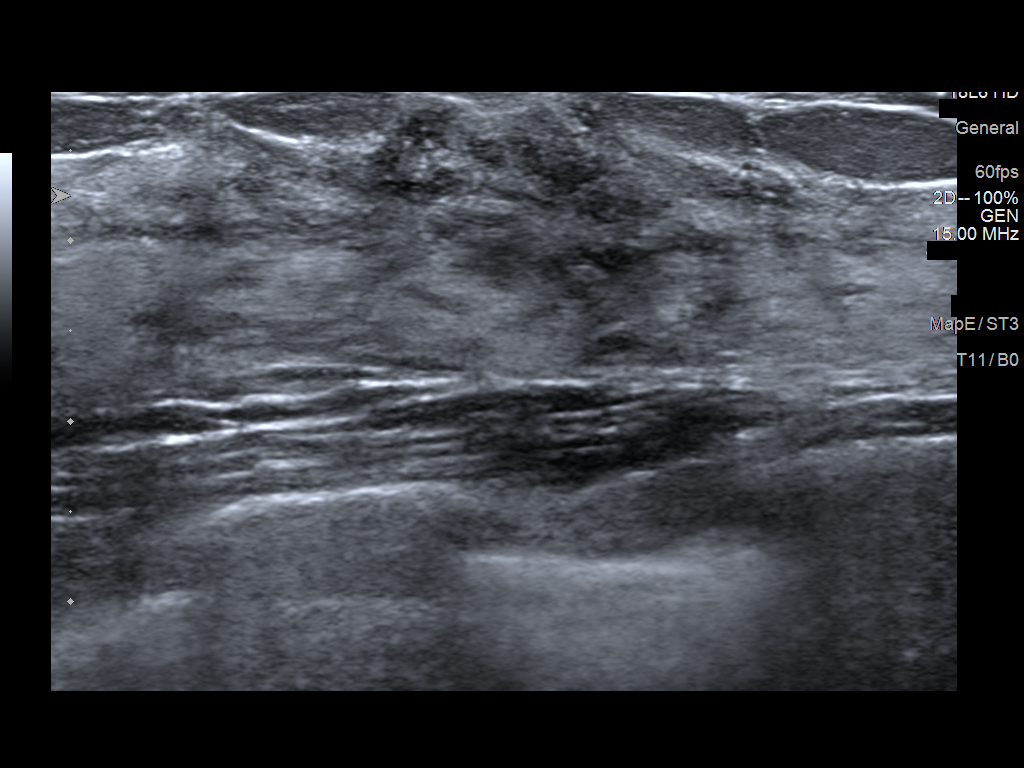
[im 3/5]
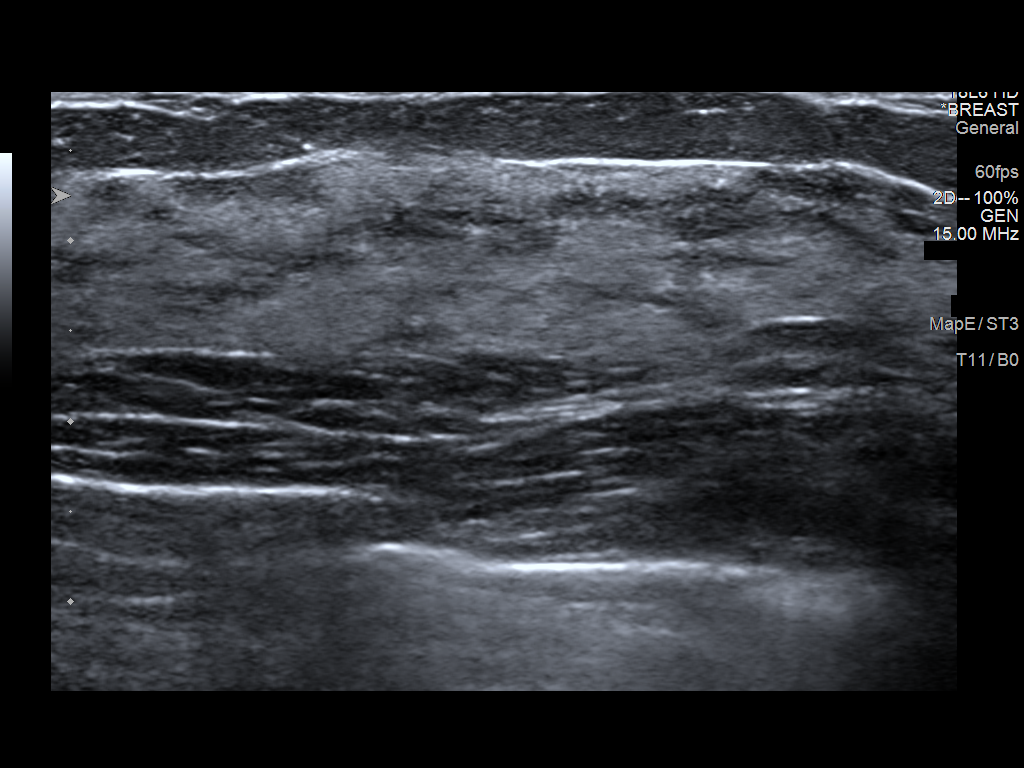
[im 4/5]
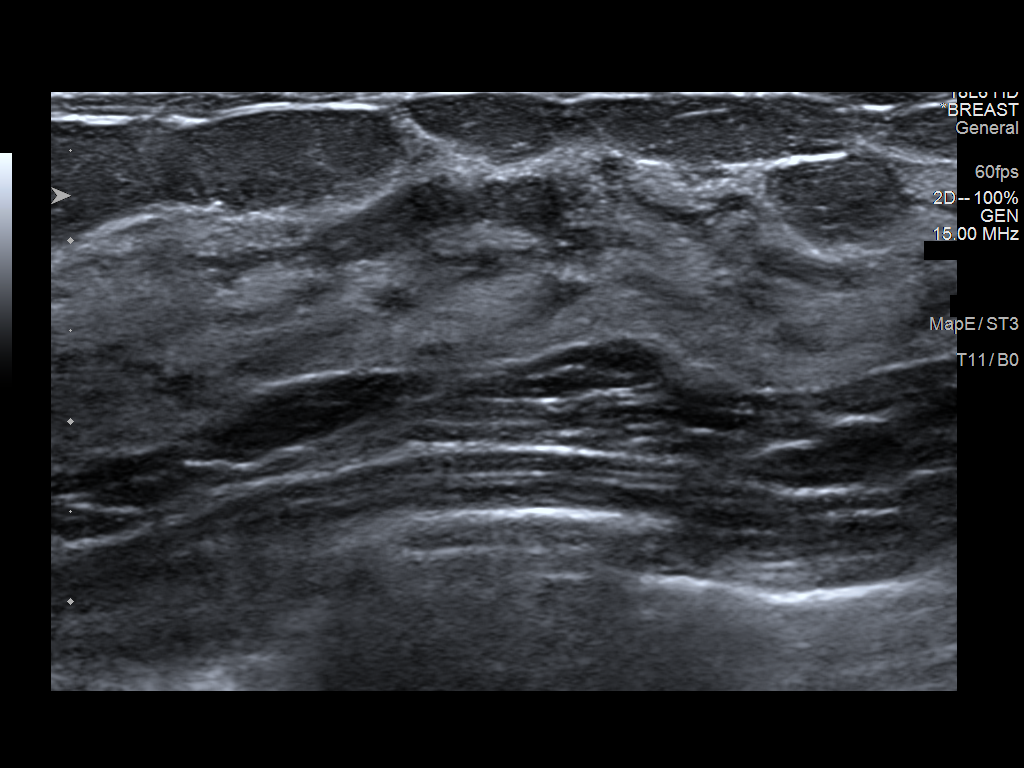
[im 5/5]
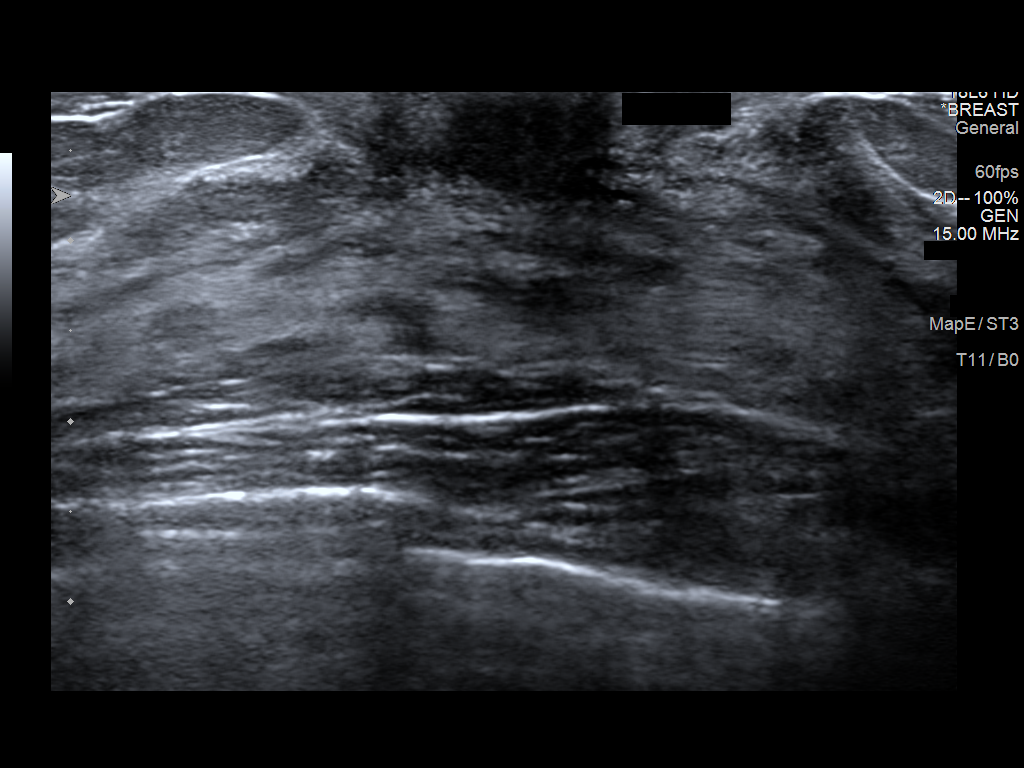

[5 of 5 positions shown; findings below may reference images not displayed]

ACR Breast Density Category d: The breast tissue is extremely dense,
which lowers the sensitivity of mammography.
FINDINGS: Mammogram:

Full field tomosynthesis and spot compression retroareolar views of
the left breast were performed. There is no definite new abnormality
in the retroareolar left breast or elsewhere to suggest the presence
of malignancy.

On physical exam the patient is unable to express discharge from the
left nipple. There are no obvious skin changes on the nipple areolar
complex.

Ultrasound:

Targeted ultrasound performed in the retroareolar aspect of the left
breast demonstrating no cystic or solid mass. No dilated duct or
fluid collection.
IMPRESSION: No mammographic or sonographic evidence of malignancy or other
abnormality in the retroareolar left breast to explain the patient's
bloody nipple discharge.

RECOMMENDATION:
1.  Bilateral breast MRI.

2.  Surgical consultation.

I have discussed the findings and recommendations with the patient.
If applicable, a reminder letter will be sent to the patient
regarding the next appointment.

BI-RADS CATEGORY  1: Negative.

ADDENDUM:
Surgical consultation has been arranged with Dr. Manzenza Fumuassuca at
[REDACTED] on March 15, 2021.

Shalley Jim RN on 03/01/2021

*** End of Addendum ***
ACR Breast Density Category d: The breast tissue is extremely dense,
which lowers the sensitivity of mammography.
FINDINGS: Mammogram:

Full field tomosynthesis and spot compression retroareolar views of
the left breast were performed. There is no definite new abnormality
in the retroareolar left breast or elsewhere to suggest the presence
of malignancy.

On physical exam the patient is unable to express discharge from the
left nipple. There are no obvious skin changes on the nipple areolar
complex.

Ultrasound:

Targeted ultrasound performed in the retroareolar aspect of the left
breast demonstrating no cystic or solid mass. No dilated duct or
fluid collection.
IMPRESSION: No mammographic or sonographic evidence of malignancy or other
abnormality in the retroareolar left breast to explain the patient's
bloody nipple discharge.

RECOMMENDATION:
1.  Bilateral breast MRI.

2.  Surgical consultation.

I have discussed the findings and recommendations with the patient.
If applicable, a reminder letter will be sent to the patient
regarding the next appointment.

BI-RADS CATEGORY  1: Negative.

## 2023-02-09 ENCOUNTER — Other Ambulatory Visit: Payer: Self-pay | Admitting: Obstetrics and Gynecology

## 2023-02-09 DIAGNOSIS — N6452 Nipple discharge: Secondary | ICD-10-CM

## 2023-03-24 ENCOUNTER — Other Ambulatory Visit: Payer: BC Managed Care – PPO

## 2023-04-10 ENCOUNTER — Other Ambulatory Visit: Payer: BC Managed Care – PPO

## 2023-04-13 ENCOUNTER — Ambulatory Visit: Payer: BC Managed Care – PPO

## 2023-04-13 ENCOUNTER — Ambulatory Visit
Admission: RE | Admit: 2023-04-13 | Discharge: 2023-04-13 | Disposition: A | Discharge status: Home / Self Care | Payer: BC Managed Care – PPO | Source: Ambulatory Visit | Attending: Obstetrics and Gynecology | Admitting: Obstetrics and Gynecology

## 2023-04-13 DIAGNOSIS — N6452 Nipple discharge: Secondary | ICD-10-CM

## 2023-05-13 ENCOUNTER — Other Ambulatory Visit: Payer: Self-pay | Admitting: Pathology

## 2023-05-24 HISTORY — PX: BREAST LUMPECTOMY: SHX2

## 2023-06-16 ENCOUNTER — Encounter: Payer: Self-pay | Admitting: Hematology

## 2023-06-16 ENCOUNTER — Inpatient Hospital Stay: Payer: BC Managed Care – PPO | Attending: Hematology | Admitting: Hematology

## 2023-06-16 VITALS — BP 109/61 | HR 75 | Temp 98.0°F | Resp 17 | Wt 112.3 lb

## 2023-06-16 DIAGNOSIS — Z170 Estrogen receptor positive status [ER+]: Secondary | ICD-10-CM | POA: Insufficient documentation

## 2023-06-16 DIAGNOSIS — C50412 Malignant neoplasm of upper-outer quadrant of left female breast: Secondary | ICD-10-CM | POA: Diagnosis present

## 2023-06-16 DIAGNOSIS — Z1721 Progesterone receptor positive status: Secondary | ICD-10-CM | POA: Insufficient documentation

## 2023-06-16 NOTE — Progress Notes (Signed)
[facility]   [phone] [phone]   Clinic New Consult Note   Patient Care Team: Salli Real, MD as PCP - General (Internal Medicine) 06/16/2023  CHIEF COMPLAINTS/PURPOSE OF CONSULTATION:  Left breast cancer, ER+  REFERRING PHYSICIAN: Dr. Wynelle Link   Discussed the use of AI scribe software for clinical note transcription with the patient, who gave verbal consent to proceed.  History of Present Illness   A 43 year old female with a recent diagnosis of breast cancer presents for a new consult.  She was referred by her PCP Dr. Rexene Alberts presents to clinic with her husband.   Patient states that she has noticed a small lump in the lateral of left breast for many years without significant change lately.  She had a routine screening mammogram in November 2024 along with ultrasound, which were negative.  Her breast density is D.    Patient and her husband went to [place] to visit family in December 2024, and had routine physical exam in multiple hospital.  Mammogram was repeated which showed a 2.2 x 2.0 cm mass in the upper outer quadrant of left breast, 3.3 cm from nipple.  Left axilla lymph node with slightly abnormal.  She underwent biopsy of the left breast mass which showed invasive ductal carcinoma, ER 90% strongly positive, PR 30% moderate intensity, HER2 2+ and FISH was negative.  She underwent left lumpectomy on May 24, 2023 and sentinel lymph node biopsy was attempted but not found.  Surgical path showed invasive ductal carcinoma, 2.3 cm.  She tolerated surgery very well and has recovered well.  Oncologist in [place] recommended adjuvant chemotherapy, radiation and antiestrogen therapy.  She decided to return to the [place] for treatment.   The patient has a family history of breast cancer, with a cousin who had the disease in her forties and an aunt with a history of breast hyperplasia.  She had genetic testing in [place] but results still pending.  She denies any  personal history of smoking and reports minimal alcohol consumption. She has one child, aged 7. The patient's menstrual history is normal, with menarche at age 24 and regular periods since. She is not currently on any medications. She has had an appendectomy in the past. The patient also mentions a recent health check revealed a nodule in her ovary, which needs further investigation.      Oncology History  Malignant neoplasm of upper-outer quadrant of left breast in female, estrogen receptor positive (HCC)  05/24/2022 Surgery   Surgical pathology: Left lumpectomy, tumor size 2.3 x 2.0 x 1.8 cm, invasive ductal carcinoma with mucinous.  Grade 2,  (+) DCIS G2. (+).  Lymphovascular invasion (+), perineural invasion(-).  Surgical margins were negative.  No lymph nodes seen in the surgical sample.  ER 75% positive, moderate to strong staining, PR 20% positive, moderate staining, HER2 2+, HER2 negative by FISH.  Ki-67 40%   05/13/2023 Initial Biopsy   Biopsy of left breast mass: invasive ductal carcinoma,   IHC: ER +90% strong staining, PR 30% positive, moderate staining, HER2 2+, Ki-67 25%, e-cadherin (+)  Left axillary node biopsy: negative for malignant cells    05/21/2023 Imaging   Mammogram showed a irregular 2.2 x 2.0 cm mass in the upper outer quadrant of left breast, 3.3 cm from nipple, with irregular border and calcification, BI-RAD 4b, biopsy recommended.  In the right breast, there is a 1 cm nodule, likely benign, and scattered calcification, likely benign.    05/24/2023 Cancer Staging  Staging form: Breast, AJCC 8th Edition - Pathologic stage from 05/24/2023: Stage IA (pT2, pN0, cM0, G2, ER+, PR+, HER2-) - Signed by Malachy Mood, MD on 06/16/2023 Histologic grading system: 3 grade system Residual tumor (R): R0 - None   06/16/2023 Initial Diagnosis   Malignant neoplasm of upper-outer quadrant of left breast in female, estrogen receptor positive [facility])       MEDICAL HISTORY:  History reviewed.  No pertinent past medical history.  SURGICAL HISTORY: Past Surgical History:  Procedure Laterality Date   APPENDECTOMY     BREAST LUMPECTOMY Left 05/24/2023    SOCIAL HISTORY: Social History   Socioeconomic History   Marital status: Married    Spouse name: Not on file   Number of children: 1   Years of education: Not on file   Highest education level: Not on file  Occupational History   Not on file  Tobacco Use   Smoking status: Never   Smokeless tobacco: Never  Substance and Sexual Activity   Alcohol use: Yes    Comment: social drinker   Drug use: No   Sexual activity: Not on file  Other Topics Concern   Not on file  Social History Narrative   Not on file   Social Drivers of Health   Financial Resource Strain: Not on file  Food Insecurity: Not on file  Transportation Needs: Not on file  Physical Activity: Not on file  Stress: Not on file  Social Connections: Not on file  Intimate Partner Violence: Not on file    FAMILY HISTORY: Family History  Problem Relation Age of Onset   Hypertension Mother    Hypertension Father    Cancer Cousin 11       breast cancer    ALLERGIES:  has no known allergies.  MEDICATIONS:  Current Outpatient Medications  Medication Sig Dispense Refill   ferrous sulfate 325 (65 FE) MG EC tablet Take 1 tablet (325 mg total) by mouth 2 (two) times daily with a meal. 60 tablet 11   No current facility-administered medications for this visit.    REVIEW OF SYSTEMS:   Constitutional: Denies fevers, chills or abnormal night sweats Eyes: Denies blurriness of vision, double vision or watery eyes Ears, nose, mouth, throat, and face: Denies mucositis or sore throat Respiratory: Denies cough, dyspnea or wheezes Cardiovascular: Denies palpitation, chest discomfort or lower extremity swelling Gastrointestinal:  Denies nausea, heartburn or change in bowel habits Skin: Denies abnormal skin rashes Lymphatics: Denies new lymphadenopathy or easy  bruising Neurological:Denies numbness, tingling or new weaknesses Behavioral/Psych: Mood is stable, no new changes  All other systems were reviewed with the patient and are negative.  PHYSICAL EXAMINATION: ECOG PERFORMANCE STATUS: 0 - Asymptomatic  Vitals:   06/16/23 1448  BP: 109/61  Pulse: 75  Resp: 17  Temp: 98 F (36.7 C)  SpO2: 100%   Filed Weights   06/16/23 1448  Weight: 112 lb 4.8 oz (50.9 kg)    GENERAL:alert, no distress and comfortable SKIN: skin color, texture, turgor are normal, no rashes or significant lesions EYES: normal, conjunctiva are pink and non-injected, sclera clear OROPHARYNX:no exudate, no erythema and lips, buccal mucosa, and tongue normal  NECK: supple, thyroid normal size, non-tender, without nodularity LYMPH:  no palpable lymphadenopathy in the cervical, axillary or inguinal LUNGS: clear to auscultation and percussion with normal breathing effort HEART: regular rate & rhythm and no murmurs and no lower extremity edema ABDOMEN:abdomen soft, non-tender and normal bowel sounds Musculoskeletal:no cyanosis of digits and no  clubbing  PSYCH: alert & oriented x 3 with fluent speech NEURO: no focal motor/sensory deficits  Physical Exam   NECK: No tenderness on palpation. BREAST: Reconstructed nipple present, scar tissue noted from incision.      LABORATORY DATA:  I have reviewed the data as listed    Latest Ref Rng & Units 04/04/2015    9:42 AM 08/25/2013   11:58 AM  CBC  WBC 4.0 - 10.5 K/uL 7.3  4.2   Hemoglobin 12.0 - 15.0 g/dL 16.1  09.6   Hematocrit 36.0 - 46.0 % 37.6  40.7   Platelets 150 - 400 K/uL 206  216       Latest Ref Rng & Units 04/04/2015    9:42 AM 08/25/2013   11:58 AM  CMP  Glucose 65 - 99 mg/dL 80  76   BUN 7 - 25 mg/dL 11  13   Creatinine 0.45 - 1.10 mg/dL 4.09  8.11   Sodium 914 - 146 mmol/L 135  139   Potassium 3.5 - 5.3 mmol/L 3.8  3.8   Chloride 98 - 110 mmol/L 102  105   CO2 20 - 31 mmol/L 24  25   Calcium 8.6 -  10.2 mg/dL 9.1  8.9   Total Protein 6.1 - 8.1 g/dL 6.6  6.5   Total Bilirubin 0.2 - 1.2 mg/dL 0.7  0.8   Alkaline Phos 33 - 115 U/L 61  50   AST 10 - 30 U/L 13  15   ALT 6 - 29 U/L 10  13      RADIOGRAPHIC STUDIES: I have personally reviewed the radiological images as listed and agreed with the findings in the report. No results found.  ASSESSMENT & PLAN:   Assessment and Plan    Invasive Ductal Carcinoma of the Left Breast, pT2N0M0, stage IA, ER+/PR+/HER2- 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 Underwent lumpectomy on May 24, 2023. Tumor size 2.4 cm. Sentinel lymph node biopsy attempted but no sentinel lymph nodes found. Family history of breast cancer in a cousin.  Discussed Oncotype DX test to determine chemotherapy need: low score (<20) may not require chemotherapy, intermediate (20-25) recommends chemotherapy, high (>25) strongly recommends chemotherapy. Discussed chemotherapy risks/benefits, including side effects and quality of life impact.  -Radiation therapy as standard post-lumpectomy treatment to reduce local recurrence risk, typically daily sessions for 4-6 weeks. Patient expressed understanding and agreed to proceed with recommended evaluations. -will review her case in tumor board especially with surgical oncologist regarding further surgery for sentinel lymph nodes. - Order Oncotype DX test if her tissue sample is adequate. -Discussed hormonal therapy with tamoxifen and ovarian suppression/removal to reduce estrogen levels. Considering genetic testing due to family history.  - Refer to radiation oncology for adjuvant radiation therapy. - Provide educational materials on breast cancer and treatment options. - Coordinate with breast cancer navigators for follow-up and support.  Ovarian Nodule Incidental ovarian nodule found during recent check-up. Likely benign but requires further evaluation. - Order  pelvic ultrasound. -she will f/u with her gynecologist for further management.  General Health Maintenance Generally healthy with no significant medical history other than current breast cancer diagnosis. History of appendectomy. Non-smoker, occasional alcohol consumption. Normal menstrual history, no other significant family history of cancer. - Encourage regular physical activity and a healthy diet. - Advise on the importance of regular follow-up appointments and screenings.  Plan -pt gave me her surgical samples slides (unstained), will give to  path lab and ask Dr. Reynolds Bowl to review and determine if tissue is adequate for Oncotype test -Will review her case in tumor conference, especially with surgical oncologist regarding further surgery. -Will refer her to radiation oncologist.    All questions were answered. The patient knows to call the clinic with any problems, questions or concerns. I spent 60 minutes counseling the patient face to face. The total time spent in the appointment was 75 minutes and more than 50% was on counseling.     Malachy Mood, MD 06/16/2023

## 2023-06-17 ENCOUNTER — Other Ambulatory Visit: Payer: Self-pay | Admitting: *Deleted

## 2023-06-17 DIAGNOSIS — Z170 Estrogen receptor positive status [ER+]: Secondary | ICD-10-CM

## 2023-06-19 ENCOUNTER — Ambulatory Visit
Admission: RE | Admit: 2023-06-19 | Discharge: 2023-06-19 | Disposition: A | Discharge status: Home / Self Care | Payer: BC Managed Care – PPO | Source: Ambulatory Visit | Attending: Hematology | Admitting: Hematology

## 2023-06-19 DIAGNOSIS — Z170 Estrogen receptor positive status [ER+]: Secondary | ICD-10-CM

## 2023-06-22 ENCOUNTER — Encounter: Payer: Self-pay | Admitting: *Deleted

## 2023-06-23 ENCOUNTER — Other Ambulatory Visit: Payer: Self-pay

## 2023-06-23 ENCOUNTER — Other Ambulatory Visit (HOSPITAL_COMMUNITY): Payer: Self-pay | Admitting: Hematology

## 2023-06-23 ENCOUNTER — Inpatient Hospital Stay
Admission: RE | Admit: 2023-06-23 | Discharge: 2023-06-23 | Disposition: A | Discharge status: Home / Self Care | Payer: Self-pay | Source: Ambulatory Visit | Attending: Hematology | Admitting: Hematology

## 2023-06-23 ENCOUNTER — Inpatient Hospital Stay
Admission: RE | Admit: 2023-06-23 | Discharge: 2023-06-23 | Disposition: A | Discharge status: Home / Self Care | Payer: Self-pay | Source: Ambulatory Visit | Attending: Hematology

## 2023-06-23 DIAGNOSIS — Z170 Estrogen receptor positive status [ER+]: Secondary | ICD-10-CM

## 2023-06-23 DIAGNOSIS — C50412 Malignant neoplasm of upper-outer quadrant of left female breast: Secondary | ICD-10-CM

## 2023-06-24 LAB — SURGICAL PATHOLOGY

## 2023-06-30 ENCOUNTER — Encounter (HOSPITAL_COMMUNITY): Payer: Self-pay

## 2023-07-03 ENCOUNTER — Telehealth: Payer: Self-pay | Admitting: Hematology

## 2023-07-03 NOTE — Telephone Encounter (Signed)
Patient is aware of scheduled appointment times/dates per provider request on 07/03/2023

## 2023-07-05 NOTE — Assessment & Plan Note (Addendum)
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 Underwent lumpectomy on May 24, 2023. Tumor size 2.4 cm. Sentinel lymph node biopsy attempted but no sentinel lymph nodes found. Family history of breast cancer in a cousin.  -Her biopsy slides for stains and reviewed in our pathology lab, which confirmed invasive ductal carcinoma, and a DCIS.  Grade 2.  ER 40% with moderate staining, PR 40% moderate to strong staining, HER2 (-) -Oncotype RS 33, with 21% predicted risk of distant recurrence with tamoxifen in the next 9 years.  Adjuvant chemotherapy is recommended -I also discussed the clinical trial option with her  -She has met our surgeon Dr. Dwain Sarna and will be scheduled for sentinel lymph node biopsy soon. -If sentinel lymph node negative, I recommend adjuvant chemotherapy TC every 3 weeks for 4 cycles.  Chemo consent obtained today.

## 2023-07-06 ENCOUNTER — Encounter: Payer: Self-pay | Admitting: Hematology

## 2023-07-06 ENCOUNTER — Inpatient Hospital Stay: Payer: BC Managed Care – PPO | Attending: Hematology | Admitting: Hematology

## 2023-07-06 ENCOUNTER — Telehealth: Payer: Self-pay | Admitting: Hematology

## 2023-07-06 ENCOUNTER — Encounter: Payer: Self-pay | Admitting: *Deleted

## 2023-07-06 VITALS — BP 113/72 | HR 79 | Temp 98.0°F | Resp 17 | Wt 112.0 lb

## 2023-07-06 DIAGNOSIS — N838 Other noninflammatory disorders of ovary, fallopian tube and broad ligament: Secondary | ICD-10-CM

## 2023-07-06 DIAGNOSIS — Z170 Estrogen receptor positive status [ER+]: Secondary | ICD-10-CM | POA: Diagnosis not present

## 2023-07-06 DIAGNOSIS — C50412 Malignant neoplasm of upper-outer quadrant of left female breast: Secondary | ICD-10-CM | POA: Diagnosis not present

## 2023-07-06 DIAGNOSIS — N839 Noninflammatory disorder of ovary, fallopian tube and broad ligament, unspecified: Secondary | ICD-10-CM | POA: Diagnosis not present

## 2023-07-06 NOTE — Telephone Encounter (Signed)
Patient's spouse was able to relay message regarding changed appointment times on 07/06/2023, patient is aware of new time frame for appointment

## 2023-07-06 NOTE — Progress Notes (Signed)
[facility]   Telephone:([phone] Fax:([phone]   Clinic Follow up Note   Patient Care Team: Salli Real, MD as PCP - General (Internal Medicine) Pershing Proud, RN as Oncology Nurse Navigator Donnelly Angelica, RN as Oncology Nurse Navigator Malachy Mood, MD as Consulting Physician (Hematology)  Date of Service:  07/06/2023  CHIEF COMPLAINT: f/u of left breast cancer  CURRENT THERAPY:  Pending sentinel lymph node biopsy and adjuvant chemotherapy  Oncology History   Malignant neoplasm of upper-outer quadrant of left breast in female, estrogen receptor positive (HCC) pT2N0M0, stage IA, ER+/PR+/HER2- 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 Underwent lumpectomy on May 24, 2023. Tumor size 2.4 cm. Sentinel lymph node biopsy attempted but no sentinel lymph nodes found. Family history of breast cancer in a cousin.  -Her biopsy slides for stains and reviewed in our pathology lab, which confirmed invasive ductal carcinoma, and a DCIS.  Grade 2.  ER 40% with moderate staining, PR 40% moderate to strong staining, HER2 (-) -Oncotype RS 33, with 21% predicted risk of distant recurrence with tamoxifen in the next 9 years.  Adjuvant chemotherapy is recommended -I also discussed the clinical trial option with her  -She has met our surgeon Dr. Dwain Sarna and will be scheduled for sentinel lymph node biopsy soon. -If sentinel lymph node negative, I recommend adjuvant chemotherapy TC every 3 weeks for 4 cycles.  Chemo consent obtained today.   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 Tumor is grade 2, ER-positive (40%), PR-positive (40%), and HER2-negative. Oncotype DX score of 33 indicates high risk of recurrence. Premenopausal with significant risk of distant recurrence within nine years if treated with  tamoxifen alone. Chemotherapy recommended due to high recurrence risk and potential benefit of reducing recurrence risk by approximately 50%. Discussed clinical trial comparing chemotherapy with ovarian suppression (via medication or surgery). Informed about potential side effects of chemotherapy, including alopecia, and the option of using a cold cap to mitigate hair loss. Discussed possibility of lymphedema post-lymph node biopsy and preventive measures such as physical therapy and compression sleeves. - Discuss chemotherapy options, including the standard TC regimen (docetaxel and cyclophosphamide) every three weeks for four cycles. - Consider port placement for chemotherapy administration. - Schedule an appointment with Dr. Dwain Sarna for further surgical evaluation and potential lymph node biopsy.  Ovarian mass Ovarian mass identified during previous evaluations in [place]. Size and impact need reassessment to determine the appropriate management plan. Patient prefers to evaluate the fibroid before considering participation in the clinical trial. - Order vaginal ultrasound to evaluate the ovarian fibroid. - Refer to GYN for further evaluation and management of the ovarian fibroid.  General Health Maintenance Advised to maintain general health and wellness, including bone health monitoring due to potential early menopause from treatment. - Recommend Vitamin D supplementation. - Plan for future bone density scans if ovarian suppression or removal is performed.  Plan -I reviewed her pathology report (done here) and her Oncotype result  - she has appointment with Dr. Dwain Sarna for surgical evaluation later this week. -I ordered pelvic ultrasound to evaluate her ovarian mass, and scheduled for later this week -She will call her gynecologist for a follow-up appointment -I will see her back 1 to 2 weeks after her surgery, to schedule her chemotherapy.      SUMMARY OF ONCOLOGIC HISTORY: Oncology  History  Malignant  neoplasm of upper-outer quadrant of left breast in female, estrogen receptor positive (HCC)  05/24/2022 Surgery   Surgical pathology: Left lumpectomy, tumor size 2.3 x 2.0 x 1.8 cm, invasive ductal carcinoma with mucinous.  Grade 2,  (+) DCIS G2. (+).  Lymphovascular invasion (+), perineural invasion(-).  Surgical margins were negative.  No lymph nodes seen in the surgical sample.  ER 75% positive, moderate to strong staining, PR 20% positive, moderate staining, HER2 2+, HER2 negative by FISH.  Ki-67 40%   05/13/2023 Initial Biopsy   Biopsy of left breast mass: invasive ductal carcinoma,   IHC: ER +90% strong staining, PR 30% positive, moderate staining, HER2 2+, Ki-67 25%, e-cadherin (+)  Left axillary node biopsy: negative for malignant cells    05/21/2023 Imaging   Mammogram showed a irregular 2.2 x 2.0 cm mass in the upper outer quadrant of left breast, 3.3 cm from nipple, with irregular border and calcification, BI-RAD 4b, biopsy recommended.  In the right breast, there is a 1 cm nodule, likely benign, and scattered calcification, likely benign.    05/24/2023 Cancer Staging   Staging form: Breast, AJCC 8th Edition - Pathologic stage from 05/24/2023: Stage IA (pT2, pN0, cM0, G2, ER+, PR+, HER2-) - Signed by Malachy Mood, MD on 06/16/2023 Histologic grading system: 3 grade system Residual tumor (R): R0 - None   06/16/2023 Initial Diagnosis   Malignant neoplasm of upper-outer quadrant of left breast in female, estrogen receptor positive (HCC)      Discussed the use of AI scribe software for clinical note transcription with the patient, who gave verbal consent to proceed.  History of Present Illness   The patient, a female with a history of breast cancer, presents for a follow-up visit. She reports no new symptoms since her last visit. The patient also mentions a fibroid in her ovary that was identified during a previous medical examination in [place]. She expresses a desire to have  this checked, but has not yet seen a gynecologist.         All other systems were reviewed with the patient and are negative.  MEDICAL HISTORY:  No past medical history on file.  SURGICAL HISTORY: Past Surgical History:  Procedure Laterality Date   APPENDECTOMY     BREAST LUMPECTOMY Left 05/24/2023    I have reviewed the social history and family history with the patient and they are unchanged from previous note.  ALLERGIES:  has no known allergies.  MEDICATIONS:  Current Outpatient Medications  Medication Sig Dispense Refill   ferrous sulfate 325 (65 FE) MG EC tablet Take 1 tablet (325 mg total) by mouth 2 (two) times daily with a meal. 60 tablet 11   No current facility-administered medications for this visit.    PHYSICAL EXAMINATION: ECOG PERFORMANCE STATUS: 0 - Asymptomatic  Vitals:   07/06/23 1505  BP: 113/72  Pulse: 79  Resp: 17  Temp: 98 F (36.7 C)  SpO2: 100%   Wt Readings from Last 3 Encounters:  07/06/23 112 lb (50.8 kg)  06/16/23 112 lb 4.8 oz (50.9 kg)  11/09/15 111 lb (50.3 kg)     GENERAL:alert, no distress and comfortable SKIN: skin color, texture, turgor are normal, no rashes or significant lesions EYES: normal, Conjunctiva are pink and non-injected, sclera clear NECK: supple, thyroid normal size, non-tender, without nodularity LYMPH:  no palpable lymphadenopathy in the cervical, axillary  LUNGS: clear to auscultation and percussion with normal breathing effort HEART: regular rate & rhythm and no murmurs and no  lower extremity edema ABDOMEN:abdomen soft, non-tender and normal bowel sounds Musculoskeletal:no cyanosis of digits and no clubbing  NEURO: alert & oriented x 3 with fluent speech, no focal motor/sensory deficits   LABORATORY DATA:  I have reviewed the data as listed    Latest Ref Rng & Units 04/04/2015    9:42 AM 08/25/2013   11:58 AM  CBC  WBC 4.0 - 10.5 K/uL 7.3  4.2   Hemoglobin 12.0 - 15.0 g/dL 78.2  95.6   Hematocrit  36.0 - 46.0 % 37.6  40.7   Platelets 150 - 400 K/uL 206  216         Latest Ref Rng & Units 04/04/2015    9:42 AM 08/25/2013   11:58 AM  CMP  Glucose 65 - 99 mg/dL 80  76   BUN 7 - 25 mg/dL 11  13   Creatinine 2.13 - 1.10 mg/dL 0.86  5.78   Sodium 469 - 146 mmol/L 135  139   Potassium 3.5 - 5.3 mmol/L 3.8  3.8   Chloride 98 - 110 mmol/L 102  105   CO2 20 - 31 mmol/L 24  25   Calcium 8.6 - 10.2 mg/dL 9.1  8.9   Total Protein 6.1 - 8.1 g/dL 6.6  6.5   Total Bilirubin 0.2 - 1.2 mg/dL 0.7  0.8   Alkaline Phos 33 - 115 U/L 61  50   AST 10 - 30 U/L 13  15   ALT 6 - 29 U/L 10  13       RADIOGRAPHIC STUDIES: I have personally reviewed the radiological images as listed and agreed with the findings in the report. No results found.    Orders Placed This Encounter  Procedures   US Pelvis Complete    Standing Status:   Future    Expected Date:   07/08/2023    Expiration Date:   07/05/2024    Reason for Exam (SYMPTOM  OR DIAGNOSIS REQUIRED):   Ovarian mass    Preferred imaging location?:   [facility]   All questions were answered. The patient knows to call the clinic with any problems, questions or concerns. No barriers to learning was detected. The total time spent in the appointment was 40 minutes.     Malachy Mood, MD 07/06/2023

## 2023-07-09 ENCOUNTER — Ambulatory Visit (HOSPITAL_COMMUNITY)
Admission: RE | Admit: 2023-07-09 | Discharge: 2023-07-09 | Disposition: A | Discharge status: Home / Self Care | Payer: BC Managed Care – PPO | Source: Ambulatory Visit | Attending: Hematology | Admitting: Hematology

## 2023-07-09 DIAGNOSIS — N838 Other noninflammatory disorders of ovary, fallopian tube and broad ligament: Secondary | ICD-10-CM | POA: Diagnosis present

## 2023-07-10 ENCOUNTER — Other Ambulatory Visit: Payer: Self-pay | Admitting: General Surgery

## 2023-07-10 ENCOUNTER — Encounter: Payer: Self-pay | Admitting: *Deleted

## 2023-07-10 ENCOUNTER — Other Ambulatory Visit: Payer: Self-pay

## 2023-07-10 ENCOUNTER — Encounter (HOSPITAL_BASED_OUTPATIENT_CLINIC_OR_DEPARTMENT_OTHER): Payer: Self-pay | Admitting: General Surgery

## 2023-07-10 DIAGNOSIS — C801 Malignant (primary) neoplasm, unspecified: Secondary | ICD-10-CM

## 2023-07-10 HISTORY — DX: Malignant (primary) neoplasm, unspecified: C80.1

## 2023-07-10 MED ORDER — CHLORHEXIDINE GLUCONATE CLOTH 2 % EX PADS
6.0000 | MEDICATED_PAD | Freq: Once | CUTANEOUS | Status: DC
Start: 2023-07-10 — End: 2023-07-17

## 2023-07-10 MED ORDER — CHLORHEXIDINE GLUCONATE CLOTH 2 % EX PADS: 6.0000 | MEDICATED_PAD | Freq: Once | CUTANEOUS | Status: DC

## 2023-07-10 MED ORDER — ENSURE PRE-SURGERY PO LIQD
296.0000 mL | Freq: Once | ORAL | Status: DC
Start: 2023-07-11 — End: 2023-07-17

## 2023-07-10 NOTE — Progress Notes (Signed)

## 2023-07-13 ENCOUNTER — Telehealth: Payer: Self-pay | Admitting: *Deleted

## 2023-07-13 ENCOUNTER — Other Ambulatory Visit: Payer: Self-pay | Admitting: Hematology

## 2023-07-13 DIAGNOSIS — Z170 Estrogen receptor positive status [ER+]: Secondary | ICD-10-CM

## 2023-07-13 MED ORDER — DEXAMETHASONE 4 MG PO TABS: ORAL_TABLET | ORAL | 1 refills | Status: DC

## 2023-07-13 MED ORDER — LIDOCAINE-PRILOCAINE 2.5-2.5 % EX CREA: TOPICAL_CREAM | CUTANEOUS | 3 refills | Status: DC

## 2023-07-13 MED ORDER — PROCHLORPERAZINE MALEATE 10 MG PO TABS: 10.0000 mg | ORAL_TABLET | Freq: Four times a day (QID) | ORAL | 1 refills | Status: AC | PRN

## 2023-07-13 MED ORDER — ONDANSETRON HCL 8 MG PO TABS: 8.0000 mg | ORAL_TABLET | Freq: Three times a day (TID) | ORAL | 1 refills | Status: AC | PRN

## 2023-07-13 NOTE — Progress Notes (Signed)
 START ON PATHWAY REGIMEN - Breast     A cycle is every 21 days:     Cyclophosphamide      Docetaxel   **Always confirm dose/schedule in your pharmacy ordering system**  Patient Characteristics: Postoperative without Neoadjuvant Therapy, M0 (Pathologic Staging), Invasive Disease, Adjuvant Therapy, HER2 Negative, ER Positive, Node Negative, pT2 or Higher, pN0, Oncotype High Risk (? 26) Therapeutic Status: Postoperative without Neoadjuvant Therapy, M0 (Pathologic Staging) AJCC Grade: G2 AJCC N Category: pN0 AJCC M Category: cM0 ER Status: Positive (+) AJCC 8 Stage Grouping: IA HER2 Status: Negative (-) Oncotype Dx Recurrence Score: 33 AJCC T Category: pT2 PR Status: Positive (+) Has this patient completed genomic testing<= Yes - Oncotype DX(R) Intent of Therapy: Curative Intent, Discussed with Patient

## 2023-07-13 NOTE — Telephone Encounter (Signed)
 Called pt to provide dignicap information. Left vm. Sent email with ordering instructions and cost as well as instructions on setting up an account. Contact information provided with questions or needs.

## 2023-07-14 ENCOUNTER — Inpatient Hospital Stay: Payer: BC Managed Care – PPO | Admitting: Hematology

## 2023-07-14 ENCOUNTER — Other Ambulatory Visit: Payer: Self-pay

## 2023-07-14 ENCOUNTER — Telehealth: Payer: Self-pay

## 2023-07-14 ENCOUNTER — Other Ambulatory Visit: Payer: Self-pay | Admitting: Hematology

## 2023-07-14 ENCOUNTER — Encounter: Payer: Self-pay | Admitting: *Deleted

## 2023-07-14 NOTE — Telephone Encounter (Signed)
 Patient notified of prior authorization approval for Lidocaine-Prilocaine 2.5% Cream (EMLA). Medication is approved through 10/12/2023. No other needs or concerns noted at this time.

## 2023-07-16 ENCOUNTER — Telehealth: Payer: Self-pay | Admitting: *Deleted

## 2023-07-16 NOTE — Telephone Encounter (Signed)
 Spoke to pt regarding dignicap as well as instructions for purchasing the cap and treatment cards. Received verbal understanding. No further needs voiced at this time.

## 2023-07-17 ENCOUNTER — Other Ambulatory Visit: Payer: Self-pay

## 2023-07-17 ENCOUNTER — Ambulatory Visit (HOSPITAL_BASED_OUTPATIENT_CLINIC_OR_DEPARTMENT_OTHER)
Admission: RE | Admit: 2023-07-17 | Discharge: 2023-07-17 | Disposition: A | Discharge status: Home / Self Care | Payer: BC Managed Care – PPO | Attending: General Surgery | Admitting: General Surgery

## 2023-07-17 ENCOUNTER — Encounter (HOSPITAL_BASED_OUTPATIENT_CLINIC_OR_DEPARTMENT_OTHER)
Admission: RE | Disposition: A | Discharge status: Home / Self Care | Payer: Self-pay | Source: Home / Self Care | Attending: General Surgery

## 2023-07-17 ENCOUNTER — Ambulatory Visit (HOSPITAL_COMMUNITY): Payer: BC Managed Care – PPO

## 2023-07-17 ENCOUNTER — Encounter (HOSPITAL_BASED_OUTPATIENT_CLINIC_OR_DEPARTMENT_OTHER): Payer: Self-pay | Admitting: General Surgery

## 2023-07-17 ENCOUNTER — Ambulatory Visit (HOSPITAL_BASED_OUTPATIENT_CLINIC_OR_DEPARTMENT_OTHER): Payer: Self-pay | Admitting: Certified Registered"

## 2023-07-17 DIAGNOSIS — Z1732 Human epidermal growth factor receptor 2 negative status: Secondary | ICD-10-CM | POA: Insufficient documentation

## 2023-07-17 DIAGNOSIS — C50112 Malignant neoplasm of central portion of left female breast: Secondary | ICD-10-CM | POA: Diagnosis present

## 2023-07-17 DIAGNOSIS — Z01818 Encounter for other preprocedural examination: Secondary | ICD-10-CM

## 2023-07-17 DIAGNOSIS — Z170 Estrogen receptor positive status [ER+]: Secondary | ICD-10-CM | POA: Insufficient documentation

## 2023-07-17 HISTORY — PX: PORTACATH PLACEMENT: SHX2246

## 2023-07-17 LAB — POCT PREGNANCY, URINE: Preg Test, Ur: NEGATIVE

## 2023-07-17 SURGERY — INSERTION, TUNNELED CENTRAL VENOUS DEVICE, WITH PORT
Anesthesia: General | Site: Chest | Laterality: Right

## 2023-07-17 MED ORDER — OXYCODONE HCL 5 MG/5ML PO SOLN: 5.0000 mg | Freq: Once | ORAL | Status: DC | PRN

## 2023-07-17 MED ORDER — BUPIVACAINE HCL (PF) 0.25 % IJ SOLN
INTRAMUSCULAR | Status: AC
  Filled 2023-07-17: qty 30

## 2023-07-17 MED ORDER — LIDOCAINE 2% (20 MG/ML) 5 ML SYRINGE
INTRAMUSCULAR | Status: AC
  Filled 2023-07-17: qty 5

## 2023-07-17 MED ORDER — ONDANSETRON HCL 4 MG/2ML IJ SOLN
INTRAMUSCULAR | Status: AC
  Filled 2023-07-17: qty 2

## 2023-07-17 MED ORDER — FENTANYL CITRATE (PF) 100 MCG/2ML IJ SOLN
INTRAMUSCULAR | Status: AC
  Filled 2023-07-17: qty 2

## 2023-07-17 MED ORDER — HEPARIN (PORCINE) IN NACL 1000-0.9 UT/500ML-% IV SOLN
INTRAVENOUS | Status: AC
  Filled 2023-07-17: qty 500

## 2023-07-17 MED ORDER — PHENYLEPHRINE 80 MCG/ML (10ML) SYRINGE FOR IV PUSH (FOR BLOOD PRESSURE SUPPORT)
PREFILLED_SYRINGE | INTRAVENOUS | Status: AC
  Filled 2023-07-17: qty 10

## 2023-07-17 MED ORDER — ACETAMINOPHEN 500 MG PO TABS
1000.0000 mg | ORAL_TABLET | ORAL | Status: AC
  Administered 2023-07-17: 1000 mg via ORAL

## 2023-07-17 MED ORDER — LIDOCAINE HCL (CARDIAC) PF 100 MG/5ML IV SOSY
PREFILLED_SYRINGE | INTRAVENOUS | Status: DC | PRN
  Administered 2023-07-17: 40 mg via INTRAVENOUS

## 2023-07-17 MED ORDER — SCOPOLAMINE 1 MG/3DAYS TD PT72
1.0000 | MEDICATED_PATCH | TRANSDERMAL | Status: DC
  Administered 2023-07-17: 1.5 mg via TRANSDERMAL

## 2023-07-17 MED ORDER — MIDAZOLAM HCL 2 MG/2ML IJ SOLN
INTRAMUSCULAR | Status: AC
  Filled 2023-07-17: qty 2

## 2023-07-17 MED ORDER — MIDAZOLAM HCL 5 MG/5ML IJ SOLN
INTRAMUSCULAR | Status: DC | PRN
  Administered 2023-07-17: 2 mg via INTRAVENOUS

## 2023-07-17 MED ORDER — FENTANYL CITRATE (PF) 100 MCG/2ML IJ SOLN: 25.0000 ug | INTRAMUSCULAR | Status: DC | PRN

## 2023-07-17 MED ORDER — MIDAZOLAM HCL 2 MG/2ML IJ SOLN: 0.5000 mg | Freq: Once | INTRAMUSCULAR | Status: DC | PRN

## 2023-07-17 MED ORDER — LACTATED RINGERS IV SOLN: INTRAVENOUS | Status: DC

## 2023-07-17 MED ORDER — PROPOFOL 500 MG/50ML IV EMUL
INTRAVENOUS | Status: DC | PRN
  Administered 2023-07-17: 150 ug/kg/min via INTRAVENOUS

## 2023-07-17 MED ORDER — BUPIVACAINE HCL (PF) 0.25 % IJ SOLN
INTRAMUSCULAR | Status: DC | PRN
  Administered 2023-07-17: 10 mL

## 2023-07-17 MED ORDER — OXYCODONE HCL 5 MG PO TABS: 5.0000 mg | ORAL_TABLET | Freq: Once | ORAL | Status: DC | PRN

## 2023-07-17 MED ORDER — ONDANSETRON HCL 4 MG/2ML IJ SOLN
INTRAMUSCULAR | Status: DC | PRN
  Administered 2023-07-17: 4 mg via INTRAVENOUS

## 2023-07-17 MED ORDER — EPHEDRINE SULFATE (PRESSORS) 50 MG/ML IJ SOLN
INTRAMUSCULAR | Status: DC | PRN
  Administered 2023-07-17: 5 mg via INTRAVENOUS

## 2023-07-17 MED ORDER — ACETAMINOPHEN 500 MG PO TABS
ORAL_TABLET | ORAL | Status: AC
  Filled 2023-07-17: qty 2

## 2023-07-17 MED ORDER — HEPARIN SOD (PORK) LOCK FLUSH 100 UNIT/ML IV SOLN
INTRAVENOUS | Status: DC | PRN
  Administered 2023-07-17: 400 [IU] via INTRAVENOUS

## 2023-07-17 MED ORDER — SCOPOLAMINE 1 MG/3DAYS TD PT72
MEDICATED_PATCH | TRANSDERMAL | Status: AC
  Filled 2023-07-17: qty 1

## 2023-07-17 MED ORDER — EPHEDRINE 5 MG/ML INJ
INTRAVENOUS | Status: AC
  Filled 2023-07-17: qty 5

## 2023-07-17 MED ORDER — DEXAMETHASONE SODIUM PHOSPHATE 10 MG/ML IJ SOLN
INTRAMUSCULAR | Status: DC | PRN
  Administered 2023-07-17: 10 mg via INTRAVENOUS

## 2023-07-17 MED ORDER — PROPOFOL 10 MG/ML IV BOLUS
INTRAVENOUS | Status: AC
  Filled 2023-07-17: qty 20

## 2023-07-17 MED ORDER — HEPARIN (PORCINE) IN NACL 2-0.9 UNITS/ML
INTRAMUSCULAR | Status: AC | PRN
Start: 2023-07-17 — End: 2023-07-17
  Administered 2023-07-17: 1 via INTRAVENOUS

## 2023-07-17 MED ORDER — HEPARIN SOD (PORK) LOCK FLUSH 100 UNIT/ML IV SOLN
INTRAVENOUS | Status: AC
  Filled 2023-07-17: qty 5

## 2023-07-17 MED ORDER — PHENYLEPHRINE HCL (PRESSORS) 10 MG/ML IV SOLN
INTRAVENOUS | Status: DC | PRN
  Administered 2023-07-17 (×2): 80 ug via INTRAVENOUS

## 2023-07-17 MED ORDER — PROPOFOL 10 MG/ML IV BOLUS
INTRAVENOUS | Status: DC | PRN
  Administered 2023-07-17: 150 mg via INTRAVENOUS

## 2023-07-17 MED ORDER — MEPERIDINE HCL 25 MG/ML IJ SOLN: 6.2500 mg | INTRAMUSCULAR | Status: DC | PRN

## 2023-07-17 MED ORDER — DEXAMETHASONE SODIUM PHOSPHATE 10 MG/ML IJ SOLN
INTRAMUSCULAR | Status: AC
  Filled 2023-07-17: qty 1

## 2023-07-17 MED ORDER — FENTANYL CITRATE (PF) 100 MCG/2ML IJ SOLN
INTRAMUSCULAR | Status: DC | PRN
  Administered 2023-07-17: 25 ug via INTRAVENOUS

## 2023-07-17 MED ORDER — CEFAZOLIN SODIUM-DEXTROSE 2-4 GM/100ML-% IV SOLN
INTRAVENOUS | Status: AC
  Filled 2023-07-17: qty 100

## 2023-07-17 MED ORDER — CEFAZOLIN SODIUM-DEXTROSE 2-4 GM/100ML-% IV SOLN
2.0000 g | INTRAVENOUS | Status: AC
  Administered 2023-07-17: 2 g via INTRAVENOUS

## 2023-07-17 SURGICAL SUPPLY — 42 items
BAG DECANTER FOR FLEXI CONT (MISCELLANEOUS) ×1 IMPLANT
BENZOIN TINCTURE PRP APPL 2/3 (GAUZE/BANDAGES/DRESSINGS) ×1 IMPLANT
BLADE SURG 11 STRL SS (BLADE) ×1 IMPLANT
BLADE SURG 15 STRL LF DISP TIS (BLADE) ×1 IMPLANT
CANISTER SUCT 1200ML W/VALVE (MISCELLANEOUS) IMPLANT
CHLORAPREP W/TINT 26 (MISCELLANEOUS) ×1 IMPLANT
COVER BACK TABLE 60X90IN (DRAPES) ×1 IMPLANT
COVER MAYO STAND STRL (DRAPES) ×1 IMPLANT
DERMABOND ADVANCED .7 DNX12 (GAUZE/BANDAGES/DRESSINGS) ×1 IMPLANT
DRAPE C-ARM 42X72 X-RAY (DRAPES) ×1 IMPLANT
DRAPE LAPAROSCOPIC ABDOMINAL (DRAPES) ×1 IMPLANT
DRAPE UTILITY XL STRL (DRAPES) ×1 IMPLANT
DRSG TEGADERM 4X4.75 (GAUZE/BANDAGES/DRESSINGS) IMPLANT
ELECT COATED BLADE 2.86 ST (ELECTRODE) ×1 IMPLANT
ELECT REM PT RETURN 9FT ADLT (ELECTROSURGICAL) ×1 IMPLANT
ELECTRODE REM PT RTRN 9FT ADLT (ELECTROSURGICAL) ×1 IMPLANT
GAUZE 4X4 16PLY ~~LOC~~+RFID DBL (SPONGE) ×1 IMPLANT
GAUZE SPONGE 4X4 12PLY STRL LF (GAUZE/BANDAGES/DRESSINGS) ×1 IMPLANT
GLOVE BIO SURGEON STRL SZ 6.5 (GLOVE) IMPLANT
GLOVE BIO SURGEON STRL SZ7 (GLOVE) ×1 IMPLANT
GLOVE BIOGEL PI IND STRL 6.5 (GLOVE) IMPLANT
GLOVE BIOGEL PI IND STRL 7.5 (GLOVE) ×1 IMPLANT
GOWN STRL REUS W/ TWL LRG LVL3 (GOWN DISPOSABLE) ×2 IMPLANT
KIT CVR 48X5XPRB PLUP LF (MISCELLANEOUS) IMPLANT
KIT PORT POWER 8FR ISP CVUE (Port) IMPLANT
NDL HYPO 25X1 1.5 SAFETY (NEEDLE) ×1 IMPLANT
NDL SAFETY ECLIPSE 18X1.5 (NEEDLE) IMPLANT
NEEDLE HYPO 25X1 1.5 SAFETY (NEEDLE) ×1 IMPLANT
PACK BASIN DAY SURGERY FS (CUSTOM PROCEDURE TRAY) ×1 IMPLANT
PENCIL SMOKE EVACUATOR (MISCELLANEOUS) ×1 IMPLANT
SLEEVE SCD COMPRESS KNEE MED (STOCKING) ×1 IMPLANT
SPIKE FLUID TRANSFER (MISCELLANEOUS) IMPLANT
STRIP CLOSURE SKIN 1/2X4 (GAUZE/BANDAGES/DRESSINGS) ×1 IMPLANT
SUT MNCRL AB 4-0 PS2 18 (SUTURE) ×1 IMPLANT
SUT PROLENE 2 0 SH DA (SUTURE) ×1 IMPLANT
SUT SILK 2 0 TIES 17X18 (SUTURE) IMPLANT
SUT VIC AB 3-0 SH 27X BRD (SUTURE) ×1 IMPLANT
SYR 5ML LUER SLIP (SYRINGE) ×1 IMPLANT
SYR CONTROL 10ML LL (SYRINGE) ×1 IMPLANT
TOWEL GREEN STERILE FF (TOWEL DISPOSABLE) ×1 IMPLANT
TUBE CONNECTING 20X1/4 (TUBING) IMPLANT
YANKAUER SUCT BULB TIP NO VENT (SUCTIONS) IMPLANT

## 2023-07-17 NOTE — Anesthesia Procedure Notes (Signed)
 Procedure Name: LMA Insertion Date/Time: 07/17/2023 7:33 AM  Performed by: Lauralyn Primes, CRNAPre-anesthesia Checklist: Patient identified, Emergency Drugs available, Suction available and Patient being monitored Patient Re-evaluated:Patient Re-evaluated prior to induction Oxygen Delivery Method: Circle system utilized Preoxygenation: Pre-oxygenation with 100% oxygen Induction Type: IV induction Ventilation: Mask ventilation without difficulty LMA: LMA inserted LMA Size: 3.0 Number of attempts: 1 Airway Equipment and Method: Bite block Placement Confirmation: positive ETCO2 Tube secured with: Tape Dental Injury: Teeth and Oropharynx as per pre-operative assessment

## 2023-07-17 NOTE — Discharge Instructions (Addendum)
 PORT-A-CATH: POST OP INSTRUCTIONS  Always review your discharge instruction sheet given to you by the facility where your surgery was performed.   A prescription for pain medication may be given to you upon discharge. Take your pain medication as prescribed, if needed. If narcotic pain medicine is not needed, then you make take acetaminophen (Tylenol) or ibuprofen (Advil) as needed.  Take your usually prescribed medications unless otherwise directed. If you need a refill on your pain medication, please contact our office. All narcotic pain medicine now requires a paper prescription.  Phoned in and fax refills are no longer allowed by law.  Prescriptions will not be filled after 5 pm or on weekends.  You should follow a light diet for the remainder of the day after your procedure. Most patients will experience some mild swelling and/or bruising in the area of the incision. It may take several days to resolve. It is common to experience some constipation if taking pain medication after surgery. Increasing fluid intake and taking a stool softener (such as Colace) will usually help or prevent this problem from occurring. A mild laxative (Milk of Magnesia or Miralax) should be taken according to package directions if there are no bowel movements after 48 hours.  Unless discharge instructions indicate otherwise, you may remove your bandages 48 hours after surgery, and you may shower at that time. You may have steri-strips (small white skin tapes) in place directly over the incision.  These strips should be left on the skin for 7-10 days.  If your surgeon used Dermabond (skin glue) on the incision, you may shower in 24 hours.  The glue will flake off over the next 2-3 weeks.  If your port is left accessed at the end of surgery (needle left in port), the dressing cannot get wet and should only by changed by a healthcare professional. When the port is no longer accessed (when the needle has been removed),  follow step 7.   ACTIVITIES:  Limit activity involving your arms for the next 72 hours. Do no strenuous exercise or activity for 1 week. You may drive when you are no longer taking prescription pain medication, you can comfortably wear a seatbelt, and you can maneuver your car. 10.You may need to see your doctor in the office for a follow-up appointment.  Please       check with your doctor.  11.When you receive a new Port-a-Cath, you will get a product guide and        ID card.  Please keep them in case you need them.  WHEN TO CALL YOUR DOCTOR [phone]): Fever over 101.0 Chills Continued bleeding from incision Increased redness and tenderness at the site Shortness of breath, difficulty breathing   The clinic staff is available to answer your questions during regular business hours. Please don't hesitate to call and ask to speak to one of the nurses or medical assistants for clinical concerns. If you have a medical emergency, go to the nearest emergency room or call 911.  A surgeon from [facility] is always on call at the hospital.     For further information, please visit [email or web address]    May take Tylenol after 12:45 pm, if needed.    Post Anesthesia Home Care Instructions  Activity: Get plenty of rest for the remainder of the day. A responsible individual must stay with you for 24 hours following the procedure.  For the next 24 hours, DO NOT: -Drive a car Environmental consultant -  Drink alcoholic beverages -Take any medication unless instructed by your physician -Make any legal decisions or sign important papers.  Meals: Start with liquid foods such as gelatin or soup. Progress to regular foods as tolerated. Avoid greasy, spicy, heavy foods. If nausea and/or vomiting occur, drink only clear liquids until the nausea and/or vomiting subsides. Call your physician if vomiting continues.  Special Instructions/Symptoms: Your throat may feel dry or  sore from the anesthesia or the breathing tube placed in your throat during surgery. If this causes discomfort, gargle with warm salt water. The discomfort should disappear within 24 hours.  If you had a scopolamine patch placed behind your ear for the management of post- operative nausea and/or vomiting:  1. The medication in the patch is effective for 72 hours, after which it should be removed.  Wrap patch in a tissue and discard in the trash. Wash hands thoroughly with soap and water. 2. You may remove the patch earlier than 72 hours if you experience unpleasant side effects which may include dry mouth, dizziness or visual disturbances. 3. Avoid touching the patch. Wash your hands with soap and water after contact with the patch.

## 2023-07-17 NOTE — Anesthesia Postprocedure Evaluation (Signed)
 Anesthesia Post Note  Patient: Alexis [name]  Procedure(s) Performed: INSERTION PORT-A-CATH WITH ULTRASOUND GUIDANCE (Right: Chest)     Patient location during evaluation: PACU Anesthesia Type: General Level of consciousness: sedated, oriented and patient cooperative Pain management: pain level controlled Vital Signs Assessment: post-procedure vital signs reviewed and stable Respiratory status: spontaneous breathing, nonlabored ventilation and respiratory function stable Cardiovascular status: blood pressure returned to baseline and stable Postop Assessment: no apparent nausea or vomiting Anesthetic complications: no   No notable events documented.  Last Vitals:  Vitals:   07/17/23 0830 07/17/23 0831  BP: (!) 89/55   Pulse: 62   Resp: 16   Temp:    SpO2: 100% 100%    Last Pain:  Vitals:   07/17/23 0821  TempSrc:   PainSc: Asleep                 Ethan Clayburn,E. Rylah Fukuda

## 2023-07-17 NOTE — H&P (Signed)
  43 year old female who was previously had a history of left nipple discharge. She was seen by Dr Luisa Hart then and recommended an MRI with further recommendations following that but I dont see she did that. She palpated a left breast mass while she was in [place] on vacation. She then underwent a lumpectomy in [place]. This was done on January 5. The lumpectomy showed a 2.4 cm invasive ductal carcinoma. This was grade 2 with 40% ER and PR staining. HER2 was negative. The margins were also negative per report. A sentinel lymph node biopsy was attempted using methylene blue it sounds like and they were not able to locate a sentinel lymph node. She has been seen by oncology here. Her Oncotype is 30 with adjuvant chemotherapy recommended. She is here today to discuss a sentinel lymph node biopsy as well as a port placement. She has had an ultrasound of her axilla that is negative now.  Review of Systems: A complete review of systems was obtained from the patient. I have reviewed this information and discussed as appropriate with the patient. See HPI as well for other ROS.  Review of Systems  All other systems reviewed and are negative.   Medical History: Past Medical History:  Diagnosis Date  Anemia  History of cancer   Past Surgical History:  Procedure Laterality Date  DEEP AXILLARY SENTINEL NODE BIOPSY / EXCISION  attempted in [place] but no nodes retrieved  MASTECTOMY PARTIAL / LUMPECTOMY Left   No Known Allergies  Current Outpatient Medications on File Prior to Visit  Medication Sig Dispense Refill  Lactobac no.41/Bifidobact no.7 (PROBIOTIC-10 ORAL) Take by mouth (Patient not taking: Reported on 07/10/2023)  multivitamin with minerals (HAIR,SKIN AND NAILS ORAL) Take by mouth (Patient not taking: Reported on 07/10/2023)  prenatal vitamin with iron-folic acid (PRENATAL TABLETS) tablet Take 1 tablet by mouth once daily (Patient not taking: Reported on 07/10/2023)   History reviewed. No pertinent  family history.   Social History   Tobacco Use  Smoking Status Never  Smokeless Tobacco Never  Marital status: Married  Tobacco Use  Smoking status: Never  Smokeless tobacco: Never  Vaping Use  Vaping status: Never Used  Substance and Sexual Activity  Alcohol use: Yes  Drug use: Never    Objective:   Vitals:  07/10/23 0812  BP: 110/70  Pulse: 94  Weight: 51.3 kg (113 lb)  Height: 157.5 cm (5\' 2" )   Body mass index is 20.67 kg/m.  Assessment and Plan:   Diagnoses and all orders for this visit:  Malignant neoplasm of central portion of left breast in female, estrogen receptor positive (CMS/HHS-HCC)   port placement  We discussed her prior treatment. She appears to have clear margins and does not need any additional breast surgery. I will follow-up to see if she has undergone genetic testing as I do not see that in her chart. We discussed a repeat left axillary sentinel lymph node biopsy with mag trace which I will likely inject today for a procedure next week. We discussed that this may not be successful. We also discussed the risk of lymphedema associated with this. We discussed the reason why oncology would like a lymph node biopsy for adjuvant therapy recommendations. We also discussed a port placement. I discussed this would be a right IJ port. We discussed the risks and benefits associated with this as well.   Addendum: patient discussed with surgeon in [place], she does have neg sn so will just place port today

## 2023-07-17 NOTE — Anesthesia Preprocedure Evaluation (Signed)
 Anesthesia Evaluation  Patient identified by MRN, date of birth, ID band Patient awake    Reviewed: Allergy & Precautions, NPO status , Patient's Chart, lab work & pertinent test results  History of Anesthesia Complications Negative for: history of anesthetic complications  Airway Mallampati: II  TM Distance: >3 FB Neck ROM: Full    Dental  (+) Dental Advisory Given   Pulmonary neg pulmonary ROS   breath sounds clear to auscultation       Cardiovascular negative cardio ROS  Rhythm:Regular Rate:Normal     Neuro/Psych negative neurological ROS     GI/Hepatic negative GI ROS, Neg liver ROS,,,  Endo/Other  negative endocrine ROS    Renal/GU negative Renal ROS     Musculoskeletal   Abdominal   Peds  Hematology negative hematology ROS (+)   Anesthesia Other Findings Breast cancer  Reproductive/Obstetrics                             Anesthesia Physical Anesthesia Plan  ASA: 2  Anesthesia Plan: General   Post-op Pain Management: Tylenol PO (pre-op)* and Minimal or no pain anticipated   Induction: Intravenous  PONV Risk Score and Plan: 3 and Ondansetron, Dexamethasone and Scopolamine patch - Pre-op  Airway Management Planned: LMA  Additional Equipment: None  Intra-op Plan:   Post-operative Plan:   Informed Consent: I have reviewed the patients History and Physical, chart, labs and discussed the procedure including the risks, benefits and alternatives for the proposed anesthesia with the patient or authorized representative who has indicated his/her understanding and acceptance.     Dental advisory given  Plan Discussed with: CRNA and Surgeon  Anesthesia Plan Comments:        Anesthesia Quick Evaluation

## 2023-07-17 NOTE — Op Note (Signed)
  Preoperative diagnosis: breast cancer Postoperative diagnosis: Same as above Procedure: Right internal jugular vein port placement with ultrasound guidance Surgeon: Dr. Harden Mo Anesthesia: General Specimens: None Complications: None Estimated blood loss: Minimal Sponge count was correct completion Disposition recovery stable condition   Indications: 47 yof with surgery in [place] for breast cancer, oncotype of 33, needs venous access for chemotherapy.     Procedure: After informed consent was obtained she was taken to the operating room.  She was given antibiotics.  SCDs were in place.  She was placed under general anesthesia without complication.  She was prepped and draped in a standard sterile surgical fashion.  A surgical timeout was then performed.   I located the internal jugular vein with ultrasound.  I then made a small nick in the skin overlying it.  I accessed the vein under ultrasound guidance with a needle.  This was clearly in the jugular vein and aspirated blood.  I placed the wire.  This was confirmed to be in position by fluoroscopy as well as by ultrasound to be in the vein.  I then infiltrated Marcaine on her chest.  I made an incision and created a pocket for the port.  I tunneled the line between the 2 sites.  I then inserted the dilator over the wire.  Under fluoroscopic guidance I then placed the dilator and remove the wire assembly.  I then placed the line through the  sheath.  The sheath was then removed.  The line was pulled back to be in the distal vena cava near the cavoatrial junction.  The line is in place and is ready for use.  I then attached the port and placed it in the pocket.  I sutured this into position with a Prolene suture.  I then aspirated blood and flushed it easily.  I placed heparin in the port.  I closed this with 3-0 Vicryl and 4-0 Monocryl.  Glue and a Steri-Strip were applied.  She tolerated this well was extubated and transferred recovery stable.

## 2023-07-17 NOTE — Transfer of Care (Signed)
 Immediate Anesthesia Transfer of Care Note  Patient: Kenlei Safi  Procedure(s) Performed: INSERTION PORT-A-CATH WITH ULTRASOUND GUIDANCE (Right: Chest)  Patient Location: PACU  Anesthesia Type:General  Level of Consciousness: drowsy  Airway & Oxygen Therapy: Patient Spontanous Breathing and Patient connected to face mask oxygen  Post-op Assessment: Report given to RN and Post -op Vital signs reviewed and stable  Post vital signs: Reviewed and stable  Last Vitals:  Vitals Value Taken Time  BP 96/65 07/17/23 0821  Temp 36.2 C 07/17/23 0821  Pulse 64 07/17/23 0823  Resp 16 07/17/23 0823  SpO2 100 % 07/17/23 0823  Vitals shown include unfiled device data.  Last Pain:  Vitals:   07/17/23 0821  TempSrc:   PainSc: Asleep      Patients Stated Pain Goal: 3 (07/17/23 1191)  Complications: No notable events documented.

## 2023-07-17 NOTE — Interval H&P Note (Signed)
 History and Physical Interval Note:  07/17/2023 7:09 AM  Alexis [name]  has presented today for surgery, with the diagnosis of LEFT BREAST CANCER.  The various methods of treatment have been discussed with the patient and family. After consideration of risks, benefits and other options for treatment, the patient has consented to  Procedure(s): INSERTION PORT-A-CATH WITH ULTRASOUND GUIDANCE (N/A) as a surgical intervention.  The patient's history has been reviewed, patient examined, no change in status, stable for surgery.  I have reviewed the patient's chart and labs.  Questions were answered to the patient's satisfaction.     [name]

## 2023-07-20 ENCOUNTER — Encounter (HOSPITAL_BASED_OUTPATIENT_CLINIC_OR_DEPARTMENT_OTHER): Payer: Self-pay | Admitting: General Surgery

## 2023-07-22 NOTE — Progress Notes (Signed)

## 2023-07-28 ENCOUNTER — Inpatient Hospital Stay: Payer: BC Managed Care – PPO

## 2023-07-28 ENCOUNTER — Inpatient Hospital Stay: Payer: BC Managed Care – PPO | Admitting: Hematology

## 2023-07-28 ENCOUNTER — Inpatient Hospital Stay (HOSPITAL_BASED_OUTPATIENT_CLINIC_OR_DEPARTMENT_OTHER)

## 2023-07-28 ENCOUNTER — Inpatient Hospital Stay: Payer: BC Managed Care – PPO | Attending: Hematology

## 2023-07-28 VITALS — BP 112/62 | HR 88 | Temp 98.0°F | Resp 20 | Ht 62.0 in | Wt 111.7 lb

## 2023-07-28 DIAGNOSIS — Z5111 Encounter for antineoplastic chemotherapy: Secondary | ICD-10-CM | POA: Insufficient documentation

## 2023-07-28 DIAGNOSIS — C50412 Malignant neoplasm of upper-outer quadrant of left female breast: Secondary | ICD-10-CM | POA: Insufficient documentation

## 2023-07-28 DIAGNOSIS — Z1732 Human epidermal growth factor receptor 2 negative status: Secondary | ICD-10-CM | POA: Diagnosis not present

## 2023-07-28 DIAGNOSIS — Z170 Estrogen receptor positive status [ER+]: Secondary | ICD-10-CM | POA: Insufficient documentation

## 2023-07-28 DIAGNOSIS — D509 Iron deficiency anemia, unspecified: Secondary | ICD-10-CM | POA: Diagnosis present

## 2023-07-28 DIAGNOSIS — Z5189 Encounter for other specified aftercare: Secondary | ICD-10-CM | POA: Diagnosis not present

## 2023-07-28 LAB — PREGNANCY, URINE: Preg Test, Ur: NEGATIVE

## 2023-07-28 LAB — CMP (CANCER CENTER ONLY)
ALT: 36 U/L (ref 0–44)
AST: 23 U/L (ref 15–41)
Albumin: 4.4 g/dL (ref 3.5–5.0)
Alkaline Phosphatase: 57 U/L (ref 38–126)
Anion gap: 5 (ref 5–15)
BUN: 15 mg/dL (ref 6–20)
CO2: 26 mmol/L (ref 22–32)
Calcium: 8.6 mg/dL — ABNORMAL LOW (ref 8.9–10.3)
Chloride: 108 mmol/L (ref 98–111)
Creatinine: 0.52 mg/dL (ref 0.44–1.00)
GFR, Estimated: >60 mL/min (ref >60)
Glucose, Bld: 129 mg/dL — ABNORMAL HIGH (ref 70–99)
Potassium: 3.9 mmol/L (ref 3.5–5.1)
Sodium: 139 mmol/L (ref 135–145)
Total Bilirubin: 0.3 mg/dL (ref 0.0–1.2)
Total Protein: 6.7 g/dL (ref 6.5–8.1)

## 2023-07-28 LAB — CBC WITH DIFFERENTIAL (CANCER CENTER ONLY)
Abs Immature Granulocytes: 0.02 10*3/uL (ref 0.00–0.07)
Basophils Absolute: 0 10*3/uL (ref 0.0–0.1)
Basophils Relative: 0 %
Eosinophils Absolute: 0 10*3/uL (ref 0.0–0.5)
Eosinophils Relative: 1 %
HCT: 34.9 % — ABNORMAL LOW (ref 36.0–46.0)
Hemoglobin: 10.3 g/dL — ABNORMAL LOW (ref 12.0–15.0)
Immature Granulocytes: 0 %
Lymphocytes Relative: 27 %
Lymphs Abs: 1.2 10*3/uL (ref 0.7–4.0)
MCH: 18.7 pg — ABNORMAL LOW (ref 26.0–34.0)
MCHC: 29.5 g/dL — ABNORMAL LOW (ref 30.0–36.0)
MCV: 63.2 fL — ABNORMAL LOW (ref 80.0–100.0)
Monocytes Absolute: 0.3 10*3/uL (ref 0.1–1.0)
Monocytes Relative: 7 %
Neutro Abs: 2.9 10*3/uL (ref 1.7–7.7)
Neutrophils Relative %: 65 %
Platelet Count: 155 10*3/uL (ref 150–400)
RBC: 5.52 MIL/uL — ABNORMAL HIGH (ref 3.87–5.11)
RDW: 18.6 % — ABNORMAL HIGH (ref 11.5–15.5)
Smear Review: NORMAL
WBC Count: 4.5 10*3/uL (ref 4.0–10.5)
nRBC: 0 % (ref 0.0–0.2)

## 2023-07-28 NOTE — Assessment & Plan Note (Signed)
 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 Underwent lumpectomy on May 24, 2023. Tumor size 2.4 cm. Sentinel lymph node biopsy attempted but no sentinel lymph nodes found. Family history of breast cancer in a cousin.  -Her biopsy slides for stains and reviewed in our pathology lab, which confirmed invasive ductal carcinoma, and a DCIS.  Grade 2.  ER 40% with moderate staining, PR 40% moderate to strong staining, HER2 (-) -Oncotype RS 33, with 21% predicted risk of distant recurrence with tamoxifen in the next 9 years.  Adjuvant chemotherapy is recommended -I also discussed the clinical trial option with her  -She has met our surgeon Dr. Dwain Sarna and will be scheduled for sentinel lymph node biopsy soon. -If sentinel lymph node negative, I recommend adjuvant chemotherapy TC every 3 weeks for 4 cycles. Will start on 07/28/2023

## 2023-07-28 NOTE — Progress Notes (Signed)
 [facility]   [phone] [phone]   Clinic Follow up Note   Patient Care Team: Salli Real, MD as PCP - General (Internal Medicine) Pershing Proud, RN as Oncology Nurse Navigator Donnelly Angelica, RN as Oncology Nurse Navigator Malachy Mood, MD as Consulting Physician (Hematology)  Date of Service:  07/28/2023  CHIEF COMPLAINT: f/u of breast cancer  CURRENT THERAPY:  Pending adjuvant chemotherapy TC  Oncology History   Malignant neoplasm of upper-outer quadrant of left breast in female, estrogen receptor positive (HCC) pT2N0M0, stage IA, ER+/PR+/HER2- 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 Underwent lumpectomy on May 24, 2023. Tumor size 2.4 cm. Sentinel lymph node biopsy attempted but no sentinel lymph nodes found. Family history of breast cancer in a cousin.  -Her biopsy slides for stains and reviewed in our pathology lab, which confirmed invasive ductal carcinoma, and a DCIS.  Grade 2.  ER 40% with moderate staining, PR 40% moderate to strong staining, HER2 (-) -Oncotype RS 33, with 21% predicted risk of distant recurrence with tamoxifen in the next 9 years.  Adjuvant chemotherapy is recommended -I also discussed the clinical trial option with her  -She has met our surgeon Dr. Dwain Sarna and will be scheduled for sentinel lymph node biopsy soon. -If sentinel lymph node negative, I recommend adjuvant chemotherapy TC every 3 weeks for 4 cycles. Will start on 07/28/2023   Assessment and Plan    Breast cancer Undergoing chemotherapy for breast cancer, starting tomorrow with a regimen of four cycles TC, each three weeks apart. Prepared with a cold cap to minimize hair loss and provided with anti-nausea medications. Common side effects include fatigue, diarrhea, and neuropathy. Advised to rest during the first week post-chemotherapy but remain active with light activities. Discussed  the risk of fever and infection due to neutropenia, typically occurring 7-10 days post-chemotherapy, and the importance of monitoring for these symptoms. Informed about using ice packs or ice gloves to prevent neuropathy and potential bone pain from white blood cell growth factor injections. - Administer chemotherapy as scheduled. - Provide anti-nausea medications post-chemotherapy. - Advise rest and light activity during the first week post-chemotherapy. - Monitor for fever and signs of infection. - Provide a chemotherapy card for emergency situations. - Advise keeping COVID and flu test kits at home. - Discuss the use of ice packs or ice gloves to prevent neuropathy. - Schedule follow-up in one week to monitor side effects and blood counts.   Anemia Experiencing anemia, managed with oral iron supplements. Reports dizziness and constipation, common side effects of iron supplementation. A recent blood test was conducted to assess the severity of anemia. If anemia persists or worsens, intravenous iron may be considered for faster improvement. - Review recent blood test results to assess anemia severity. - Consider intravenous iron if anemia is severe. - Monitor for side effects of iron supplementation, including constipation and dizziness.  Use of traditional [place] medicine (Pian Vanice Sarah) Pt inquired about using Pian Vanice Sarah, a traditional [place] medicine, during chemotherapy. Expressed concerns about potential interactions with chemotherapy and advised against using it on the day of treatment. Advised to wait 2-3 days post-chemotherapy before considering its use, but the decision was left to her discretion. - Advise against using [place] during chemotherapy.  Plan -She has chemo class today -For cycle TC tomorrow -Lab, flush and follow-up 1 week after chemo for toxicity checkup      SUMMARY OF  ONCOLOGIC HISTORY: Oncology History  Malignant neoplasm of upper-outer  quadrant of left breast in female, estrogen receptor positive (HCC)  05/24/2022 Surgery   Surgical pathology: Left lumpectomy, tumor size 2.3 x 2.0 x 1.8 cm, invasive ductal carcinoma with mucinous.  Grade 2,  (+) DCIS G2. (+).  Lymphovascular invasion (+), perineural invasion(-).  Surgical margins were negative.  No lymph nodes seen in the surgical sample.  ER 75% positive, moderate to strong staining, PR 20% positive, moderate staining, HER2 2+, HER2 negative by FISH.  Ki-67 40%   05/13/2023 Initial Biopsy   Biopsy of left breast mass: invasive ductal carcinoma,   IHC: ER +90% strong staining, PR 30% positive, moderate staining, HER2 2+, Ki-67 25%, e-cadherin (+)  Left axillary node biopsy: negative for malignant cells    05/21/2023 Imaging   Mammogram showed a irregular 2.2 x 2.0 cm mass in the upper outer quadrant of left breast, 3.3 cm from nipple, with irregular border and calcification, BI-RAD 4b, biopsy recommended.  In the right breast, there is a 1 cm nodule, likely benign, and scattered calcification, likely benign.    05/24/2023 Cancer Staging   Staging form: Breast, AJCC 8th Edition - Pathologic stage from 05/24/2023: Stage IA (pT2, pN0, cM0, G2, ER+, PR+, HER2-) - Signed by Malachy Mood, MD on 06/16/2023 Histologic grading system: 3 grade system Residual tumor (R): R0 - None   06/16/2023 Initial Diagnosis   Malignant neoplasm of upper-outer quadrant of left breast in female, estrogen receptor positive (HCC)   07/29/2023 -  Chemotherapy   Patient is on Treatment Plan : BREAST TC q21d        Discussed the use of AI scribe software for clinical note transcription with the patient, who gave verbal consent to proceed.  History of Present Illness   [name] with a history of breast cancer presents for a follow-up visit. She is about to commence chemotherapy and has prepared by obtaining her cold cap and nausea medication. She reports experiencing dizziness, which she attributes  to anemia. She has been taking iron supplements for the past five days to manage this. She expresses concern about the potential side effects of chemotherapy, including fatigue, diarrhea, and neuropathy. She is particularly worried about the risk of infection and the need to monitor for fever. She also mentions the possibility of using traditional [place] medicine to aid in the detoxification process post-chemotherapy.         All other systems were reviewed with the patient and are negative.  MEDICAL HISTORY:  Past Medical History:  Diagnosis Date   Cancer (HCC) 07/10/2023   Breast    SURGICAL HISTORY: Past Surgical History:  Procedure Laterality Date   APPENDECTOMY     BREAST LUMPECTOMY Left 05/24/2023   PORTACATH PLACEMENT Right 07/17/2023   Procedure: INSERTION PORT-A-CATH WITH ULTRASOUND GUIDANCE;  Surgeon: [name], MD;  Location: [facility];  Service: General;  Laterality: Right;    I have reviewed the social history and family history with the patient and they are unchanged from previous note.  ALLERGIES:  has no known allergies.  MEDICATIONS:  Current Outpatient Medications  Medication Sig Dispense Refill   dexamethasone (DECADRON) 4 MG tablet Take 2 tabs by mouth 2 times daily starting day before chemo. Then take 2 tabs daily for 2 days starting day after chemo. Take with food. 30 tablet 1   lidocaine-prilocaine (EMLA) cream Apply to affected area once 30 g 3   ondansetron (ZOFRAN) 8 MG tablet  Take 1 tablet (8 mg total) by mouth every 8 (eight) hours as needed for nausea or vomiting. Start on the third day after chemotherapy. 30 tablet 1   prochlorperazine (COMPAZINE) 10 MG tablet Take 1 tablet (10 mg total) by mouth every 6 (six) hours as needed for nausea or vomiting. 30 tablet 1   No current facility-administered medications for this visit.    PHYSICAL EXAMINATION: ECOG PERFORMANCE STATUS: 0 - Asymptomatic  Vitals:   07/28/23 1351  BP:  112/62  Pulse: 88  Resp: 20  Temp: 98 F (36.7 C)  SpO2: 99%   Wt Readings from Last 3 Encounters:  07/28/23 111 lb 11.2 oz (50.7 kg)  07/17/23 111 lb 5.3 oz (50.5 kg)  07/06/23 112 lb (50.8 kg)     GENERAL:alert, no distress and comfortable SKIN: skin color, texture, turgor are normal, no rashes or significant lesions EYES: normal, Conjunctiva are pink and non-injected, sclera clear Musculoskeletal:no cyanosis of digits and no clubbing  NEURO: alert & oriented x 3 with fluent speech, no focal motor/sensory deficits  . LABORATORY DATA:  I have reviewed the data as listed    Latest Ref Rng & Units 07/28/2023    1:23 PM 04/04/2015    9:42 AM 08/25/2013   11:58 AM  CBC  WBC 4.0 - 10.5 K/uL 4.5  7.3  4.2   Hemoglobin 12.0 - 15.0 g/dL 16.1  09.6  04.5   Hematocrit 36.0 - 46.0 % 34.9  37.6  40.7   Platelets 150 - 400 K/uL 155  206  216         Latest Ref Rng & Units 07/28/2023    1:23 PM 04/04/2015    9:42 AM 08/25/2013   11:58 AM  CMP  Glucose 70 - 99 mg/dL 409  80  76   BUN 6 - 20 mg/dL 15  11  13    Creatinine 0.44 - 1.00 mg/dL 8.11  9.14  7.82   Sodium 135 - 145 mmol/L 139  135  139   Potassium 3.5 - 5.1 mmol/L 3.9  3.8  3.8   Chloride 98 - 111 mmol/L 108  102  105   CO2 22 - 32 mmol/L 26  24  25    Calcium 8.9 - 10.3 mg/dL 8.6  9.1  8.9   Total Protein 6.5 - 8.1 g/dL 6.7  6.6  6.5   Total Bilirubin 0.0 - 1.2 mg/dL 0.3  0.7  0.8   Alkaline Phos 38 - 126 U/L 57  61  50   AST 15 - 41 U/L 23  13  15    ALT 0 - 44 U/L 36  10  13       RADIOGRAPHIC STUDIES: I have personally reviewed the radiological images as listed and agreed with the findings in the report. No results found.    No orders of the defined types were placed in this encounter.  All questions were answered. The patient knows to call the clinic with any problems, questions or concerns. No barriers to learning was detected. The total time spent in the appointment was 25 minutes.     Malachy Mood,  MD 07/28/2023

## 2023-07-29 ENCOUNTER — Inpatient Hospital Stay: Payer: BC Managed Care – PPO

## 2023-07-29 ENCOUNTER — Other Ambulatory Visit

## 2023-07-29 ENCOUNTER — Other Ambulatory Visit: Payer: Self-pay

## 2023-07-29 ENCOUNTER — Other Ambulatory Visit: Payer: Self-pay | Admitting: Hematology

## 2023-07-29 VITALS — BP 102/63 | HR 69 | Temp 97.7°F | Resp 18

## 2023-07-29 DIAGNOSIS — N838 Other noninflammatory disorders of ovary, fallopian tube and broad ligament: Secondary | ICD-10-CM

## 2023-07-29 DIAGNOSIS — R110 Nausea: Secondary | ICD-10-CM

## 2023-07-29 DIAGNOSIS — Z170 Estrogen receptor positive status [ER+]: Secondary | ICD-10-CM

## 2023-07-29 DIAGNOSIS — Z5111 Encounter for antineoplastic chemotherapy: Secondary | ICD-10-CM | POA: Diagnosis not present

## 2023-07-29 MED ORDER — HEPARIN SOD (PORK) LOCK FLUSH 100 UNIT/ML IV SOLN
500.0000 [IU] | Freq: Once | INTRAVENOUS | Status: AC | PRN
  Administered 2023-07-29: 500 [IU]

## 2023-07-29 MED ORDER — DEXAMETHASONE SODIUM PHOSPHATE 10 MG/ML IJ SOLN
10.0000 mg | Freq: Once | INTRAMUSCULAR | Status: AC
  Administered 2023-07-29: 10 mg via INTRAVENOUS
  Filled 2023-07-29: qty 1

## 2023-07-29 MED ORDER — SODIUM CHLORIDE 0.9 % IV SOLN
600.0000 mg/m2 | Freq: Once | INTRAVENOUS | Status: AC
  Administered 2023-07-29: 900 mg via INTRAVENOUS
  Filled 2023-07-29: qty 45

## 2023-07-29 MED ORDER — SODIUM CHLORIDE 0.9 % IV SOLN: INTRAVENOUS | Status: DC

## 2023-07-29 MED ORDER — SODIUM CHLORIDE 0.9 % IV SOLN
75.0000 mg/m2 | Freq: Once | INTRAVENOUS | Status: AC
  Administered 2023-07-29: 112 mg via INTRAVENOUS
  Filled 2023-07-29: qty 11.2

## 2023-07-29 MED ORDER — PROCHLORPERAZINE MALEATE 10 MG PO TABS
10.0000 mg | ORAL_TABLET | Freq: Once | ORAL | Status: AC
  Administered 2023-07-29: 10 mg via ORAL
  Filled 2023-07-29: qty 1

## 2023-07-29 MED ORDER — LORAZEPAM 1 MG PO TABS: 1.0000 mg | ORAL_TABLET | Freq: Three times a day (TID) | ORAL | 0 refills | Status: DC | PRN

## 2023-07-29 MED ORDER — SODIUM CHLORIDE 0.9% FLUSH
10.0000 mL | INTRAVENOUS | Status: DC | PRN
  Administered 2023-07-29: 10 mL

## 2023-07-29 MED ORDER — PROCHLORPERAZINE EDISYLATE 10 MG/2ML IJ SOLN: 10.0000 mg | Freq: Once | INTRAMUSCULAR | Status: DC

## 2023-07-29 MED ORDER — PALONOSETRON HCL INJECTION 0.25 MG/5ML
0.2500 mg | Freq: Once | INTRAVENOUS | Status: AC
Start: 2023-07-29 — End: 2023-07-29
  Administered 2023-07-29: 0.25 mg via INTRAVENOUS
  Filled 2023-07-29: qty 5

## 2023-07-29 NOTE — Patient Instructions (Signed)
 [facility] - A DEPT OF [facility]  Discharge Instructions: Thank you for choosing [facility] to provide your oncology and hematology care.   If you have a lab appointment with the Cancer Center, please go directly to the Cancer Center and check in at the registration area.   Wear comfortable clothing and clothing appropriate for easy access to any Portacath or PICC line.   We strive to give you quality time with your provider. You may need to reschedule your appointment if you arrive late (15 or more minutes).  Arriving late affects you and other patients whose appointments are after yours.  Also, if you miss three or more appointments without notifying the office, you may be dismissed from the clinic at the provider's discretion.      For prescription refill requests, have your pharmacy contact our office and allow 72 hours for refills to be completed.    Today you received the following chemotherapy and/or immunotherapy agents: Docetaxel, Cytoxan.       To help prevent nausea and vomiting after your treatment, we encourage you to take your nausea medication as directed.  BELOW ARE SYMPTOMS THAT SHOULD BE REPORTED IMMEDIATELY: *FEVER GREATER THAN 100.4 F (38 C) OR HIGHER *CHILLS OR SWEATING *NAUSEA AND VOMITING THAT IS NOT CONTROLLED WITH YOUR NAUSEA MEDICATION *UNUSUAL SHORTNESS OF BREATH *UNUSUAL BRUISING OR BLEEDING *URINARY PROBLEMS (pain or burning when urinating, or frequent urination) *BOWEL PROBLEMS (unusual diarrhea, constipation, pain near the anus) TENDERNESS IN MOUTH AND THROAT WITH OR WITHOUT PRESENCE OF ULCERS (sore throat, sores in mouth, or a toothache) UNUSUAL RASH, SWELLING OR PAIN  UNUSUAL VAGINAL DISCHARGE OR ITCHING   Items with * indicate a potential emergency and should be followed up as soon as possible or go to the Emergency Department if any problems should occur.  Please show the CHEMOTHERAPY ALERT CARD or  IMMUNOTHERAPY ALERT CARD at check-in to the Emergency Department and triage nurse.  Should you have questions after your visit or need to cancel or reschedule your appointment, please contact [facility] - A DEPT OF [facility]  Dept: [phone]  and follow the prompts.  Office hours are 8:00 a.m. to 4:30 p.m. Monday - Friday. Please note that voicemails left after 4:00 p.m. may not be returned until the following business day.  We are closed weekends and major holidays. You have access to a nurse at all times for urgent questions. Please call the main number to the clinic Dept: [phone] and follow the prompts.   For any non-urgent questions, you may also contact your provider using MyChart. We now offer e-Visits for anyone 72 and older to request care online for non-urgent symptoms. For details visit [email or web address].   Also download the MyChart app! Go to the app store, search "MyChart", open the app, select [facility], and log in with your MyChart username and password.  Docetaxel Injection What is this medication? DOCETAXEL (doe se TAX el) treats some types of cancer. It works by slowing down the growth of cancer cells. This medicine may be used for other purposes; ask your health care provider or pharmacist if you have questions. COMMON BRAND NAME(S): BEIZRAY, Docefrez, Docivyx, Taxotere What should I tell my care team before I take this medication? They need to know if you have any of these conditions: Kidney disease Liver disease Low white blood cell levels Tingling of the fingers or toes  or other nerve disorder An unusual or allergic reaction to docetaxel, polysorbate 80, other medications, foods, dyes, or preservatives Pregnant or trying to get pregnant Breast-feeding How should I use this medication? This medication is injected into a vein. It is given by your care team in a hospital or clinic setting. Talk to your care team about the  use of this medication in children. Special care may be needed. Overdosage: If you think you have taken too much of this medicine contact a poison control center or emergency room at once. NOTE: This medicine is only for you. Do not share this medicine with others. What if I miss a dose? Keep appointments for follow-up doses. It is important not to miss your dose. Call your care team if you are unable to keep an appointment. What may interact with this medication? Do not take this medication with any of the following: Live virus vaccines This medication may also interact with the following: Certain antibiotics, such as clarithromycin, telithromycin Certain antivirals for HIV or hepatitis Certain medications for fungal infections, such as itraconazole, ketoconazole, voriconazole Grapefruit juice Nefazodone Supplements, such as St. John's wort This list may not describe all possible interactions. Give your health care provider a list of all the medicines, herbs, non-prescription drugs, or dietary supplements you use. Also tell them if you smoke, drink alcohol, or use illegal drugs. Some items may interact with your medicine. What should I watch for while using this medication? This medication may make you feel generally unwell. This is not uncommon as chemotherapy can affect healthy cells as well as cancer cells. Report any side effects. Continue your course of treatment even though you feel ill unless your care team tells you to stop. You may need blood work done while you are taking this medication. This medication can cause serious side effects and infusion reactions. To reduce the risk, your care team may give you other medications to take before receiving this one. Be sure to follow the directions from your care team. This medication may increase your risk of getting an infection. Call your care team for advice if you get a fever, chills, sore throat, or other symptoms of a cold or flu. Do not  treat yourself. Try to avoid being around people who are sick. Avoid taking medications that contain aspirin, acetaminophen, ibuprofen, naproxen, or ketoprofen unless instructed by your care team. These medications may hide a fever. Be careful brushing or flossing your teeth or using a toothpick because you may get an infection or bleed more easily. If you have any dental work done, tell your dentist you are receiving this medication. Some products may contain alcohol. Ask your care team if this medication contains alcohol. Be sure to tell all care teams you are taking this medicine. Certain medications, like metronidazole and disulfiram, can cause an unpleasant reaction when taken with alcohol. The reaction includes flushing, headache, nausea, vomiting, sweating, and increased thirst. The reaction can last from 30 minutes to several hours. This medication may affect your coordination, reaction time, or judgement. Do not drive or operate machinery until you know how this medication affects you. Sit up or stand slowly to reduce the risk of dizzy or fainting spells. Drinking alcohol with this medication can increase the risk of these side effects. Talk to your care team about your risk of cancer. You may be more at risk for certain types of cancer if you take this medication. Talk to your care team if you wish to become  pregnant or think you might be pregnant. This medication can cause serious birth defects if taken during pregnancy or if you get pregnant within 2 months after stopping therapy. A negative pregnancy test is required before starting this medication. A reliable form of contraception is recommended while taking this medication and for 2 months after stopping it. Talk to your care team about reliable forms of contraception. Do not breast-feed while taking this medication and for 1 week after stopping therapy. Use a condom during sex and for 4 months after stopping therapy. Tell your care team right  away if you think your partner might be pregnant. This medication can cause serious birth defects. This medication may cause infertility. Talk to your care team if you are concerned about your fertility. What side effects may I notice from receiving this medication? Side effects that you should report to your care team as soon as possible: Allergic reactions--skin rash, itching, hives, swelling of the face, lips, tongue, or throat Change in vision such as blurry vision, seeing halos around lights, vision loss Infection--fever, chills, cough, or sore throat Infusion reactions--chest pain, shortness of breath or trouble breathing, feeling faint or lightheaded Low red blood cell level--unusual weakness or fatigue, dizziness, headache, trouble breathing Pain, tingling, or numbness in the hands or feet Painful swelling, warmth, or redness of the skin, blisters or sores at the infusion site Redness, blistering, peeling, or loosening of the skin, including inside the mouth Sudden or severe stomach pain, bloody diarrhea, fever, nausea, vomiting Swelling of the ankles, hands, or feet Tumor lysis syndrome (TLS)--nausea, vomiting, diarrhea, decrease in the amount of urine, dark urine, unusual weakness or fatigue, confusion, muscle pain or cramps, fast or irregular heartbeat, joint pain Unusual bruising or bleeding Side effects that usually do not require medical attention (report to your care team if they continue or are bothersome): Change in nail shape, thickness, or color Change in taste Hair loss Increased tears This list may not describe all possible side effects. Call your doctor for medical advice about side effects. You may report side effects to FDA at 1-800-FDA-1088. Where should I keep my medication? This medication is given in a hospital or clinic. It will not be stored at home. NOTE: This sheet is a summary. It may not cover all possible information. If you have questions about this  medicine, talk to your doctor, pharmacist, or health care provider.  2024 Elsevier/Gold Standard (2021-07-11 00:00:00) Cyclophosphamide Injection What is this medication? CYCLOPHOSPHAMIDE (sye kloe FOSS fa mide) treats some types of cancer. It works by slowing down the growth of cancer cells. This medicine may be used for other purposes; ask your health care provider or pharmacist if you have questions. COMMON BRAND NAME(S): Cyclophosphamide, Cytoxan, Neosar What should I tell my care team before I take this medication? They need to know if you have any of these conditions: Heart disease Irregular heartbeat or rhythm Infection Kidney problems Liver disease Low blood cell levels (white cells, platelets, or red blood cells) Lung disease Previous radiation Trouble passing urine An unusual or allergic reaction to cyclophosphamide, other medications, foods, dyes, or preservatives Pregnant or trying to get pregnant Breast-feeding How should I use this medication? This medication is injected into a vein. It is given by your care team in a hospital or clinic setting. Talk to your care team about the use of this medication in children. Special care may be needed. Overdosage: If you think you have taken too much of this medicine contact a  poison control center or emergency room at once. NOTE: This medicine is only for you. Do not share this medicine with others. What if I miss a dose? Keep appointments for follow-up doses. It is important not to miss your dose. Call your care team if you are unable to keep an appointment. What may interact with this medication? Amphotericin B Amiodarone Azathioprine Certain antivirals for HIV or hepatitis Certain medications for blood pressure, such as enalapril, lisinopril, quinapril Cyclosporine Diuretics Etanercept Indomethacin Medications that relax muscles Metronidazole Natalizumab Tamoxifen Warfarin This list may not describe all possible  interactions. Give your health care provider a list of all the medicines, herbs, non-prescription drugs, or dietary supplements you use. Also tell them if you smoke, drink alcohol, or use illegal drugs. Some items may interact with your medicine. What should I watch for while using this medication? This medication may make you feel generally unwell. This is not uncommon as chemotherapy can affect healthy cells as well as cancer cells. Report any side effects. Continue your course of treatment even though you feel ill unless your care team tells you to stop. You may need blood work while you are taking this medication. This medication may increase your risk of getting an infection. Call your care team for advice if you get a fever, chills, sore throat, or other symptoms of a cold or flu. Do not treat yourself. Try to avoid being around people who are sick. Avoid taking medications that contain aspirin, acetaminophen, ibuprofen, naproxen, or ketoprofen unless instructed by your care team. These medications may hide a fever. Be careful brushing or flossing your teeth or using a toothpick because you may get an infection or bleed more easily. If you have any dental work done, tell your dentist you are receiving this medication. Drink water or other fluids as directed. Urinate often, even at night. Some products may contain alcohol. Ask your care team if this medication contains alcohol. Be sure to tell all care teams you are taking this medicine. Certain medicines, like metronidazole and disulfiram, can cause an unpleasant reaction when taken with alcohol. The reaction includes flushing, headache, nausea, vomiting, sweating, and increased thirst. The reaction can last from 30 minutes to several hours. Talk to your care team if you wish to become pregnant or think you might be pregnant. This medication can cause serious birth defects if taken during pregnancy and for 1 year after the last dose. A negative  pregnancy test is required before starting this medication. A reliable form of contraception is recommended while taking this medication and for 1 year after the last dose. Talk to your care team about reliable forms of contraception. Do not father a child while taking this medication and for 4 months after the last dose. Use a condom during this time period. Do not breast-feed while taking this medication or for 1 week after the last dose. This medication may cause infertility. Talk to your care team if you are concerned about your fertility. Talk to your care team about your risk of cancer. You may be more at risk for certain types of cancer if you take this medication. What side effects may I notice from receiving this medication? Side effects that you should report to your care team as soon as possible: Allergic reactions--skin rash, itching, hives, swelling of the face, lips, tongue, or throat Dry cough, shortness of breath or trouble breathing Heart failure--shortness of breath, swelling of the ankles, feet, or hands, sudden weight gain, unusual weakness  or fatigue Heart muscle inflammation--unusual weakness or fatigue, shortness of breath, chest pain, fast or irregular heartbeat, dizziness, swelling of the ankles, feet, or hands Heart rhythm changes--fast or irregular heartbeat, dizziness, feeling faint or lightheaded, chest pain, trouble breathing Infection--fever, chills, cough, sore throat, wounds that don't heal, pain or trouble when passing urine, general feeling of discomfort or being unwell Kidney injury--decrease in the amount of urine, swelling of the ankles, hands, or feet Liver injury--right upper belly pain, loss of appetite, nausea, light-colored stool, dark yellow or brown urine, yellowing skin or eyes, unusual weakness or fatigue Low red blood cell level--unusual weakness or fatigue, dizziness, headache, trouble breathing Low sodium level--muscle weakness, fatigue, dizziness,  headache, confusion Red or dark brown urine Unusual bruising or bleeding Side effects that usually do not require medical attention (report to your care team if they continue or are bothersome): Hair loss Irregular menstrual cycles or spotting Loss of appetite Nausea Pain, redness, or swelling with sores inside the mouth or throat Vomiting This list may not describe all possible side effects. Call your doctor for medical advice about side effects. You may report side effects to FDA at 1-800-FDA-1088. Where should I keep my medication? This medication is given in a hospital or clinic. It will not be stored at home. NOTE: This sheet is a summary. It may not cover all possible information. If you have questions about this medicine, talk to your doctor, pharmacist, or health care provider.  2024 Elsevier/Gold Standard (2021-09-20 00:00:00)

## 2023-07-30 ENCOUNTER — Telehealth: Payer: Self-pay

## 2023-07-30 ENCOUNTER — Other Ambulatory Visit: Payer: Self-pay

## 2023-07-30 NOTE — Telephone Encounter (Signed)
-----   Message from Nurse Threasa Beards sent at 07/29/2023  1:26 PM EDT ----- Regarding: Dr Mosetta Putt pt, first time Docetaxel and Cytoxan Dr Mosetta Putt pt came in 3/12 for first time Docetaxel and Cytoxan. Tolerated infusions well. Struggled with moderate nausea afterward for which Compazine was given to good effect. Needs call back.

## 2023-07-30 NOTE — Telephone Encounter (Signed)
 Alexis [name] states that she is a little nauseated this am. She is going to eat and take the decadron shortly.  She will use the Ativan if needed in  a couple of hours.  She is drinking and urinating well.  She was concerned that her cheeks are red since she left [facility] yesterday. Told her that redness can occur from the IV steroids that she received. It will gradually decrease  during the day.  She knows to call the office at [phone] if  she has any questions or concerns.

## 2023-07-30 NOTE — Progress Notes (Signed)
 Called Dr. Salli Real to request patients last labs results. Office stated they would fax over the last results as soon as the nurse comes back from lunch.

## 2023-07-31 ENCOUNTER — Inpatient Hospital Stay (HOSPITAL_BASED_OUTPATIENT_CLINIC_OR_DEPARTMENT_OTHER): Payer: BC Managed Care – PPO

## 2023-07-31 ENCOUNTER — Ambulatory Visit: Payer: BC Managed Care – PPO

## 2023-07-31 ENCOUNTER — Other Ambulatory Visit: Payer: Self-pay | Admitting: Hematology

## 2023-07-31 VITALS — BP 103/64 | HR 72 | Temp 99.1°F | Resp 17

## 2023-07-31 DIAGNOSIS — C50412 Malignant neoplasm of upper-outer quadrant of left female breast: Secondary | ICD-10-CM

## 2023-07-31 DIAGNOSIS — Z5111 Encounter for antineoplastic chemotherapy: Secondary | ICD-10-CM | POA: Diagnosis not present

## 2023-07-31 DIAGNOSIS — D500 Iron deficiency anemia secondary to blood loss (chronic): Secondary | ICD-10-CM | POA: Insufficient documentation

## 2023-07-31 MED ORDER — PEGFILGRASTIM-JMDB 6 MG/0.6ML ~~LOC~~ SOSY
6.0000 mg | PREFILLED_SYRINGE | Freq: Once | SUBCUTANEOUS | Status: AC
  Administered 2023-07-31: 6 mg via SUBCUTANEOUS
  Filled 2023-07-31: qty 0.6

## 2023-08-03 ENCOUNTER — Other Ambulatory Visit: Payer: Self-pay

## 2023-08-03 NOTE — Progress Notes (Signed)
 Patient called in stating she is having abd pain and she hasn't had a bm in 3 days. She is taking OTC iron tablets for the past couple of weeks. Patient is not having any other symptoms. Patient stated she has miralax at home but didn't want to take it till she spoke with someone in our office. I spoke with Vincent Gros NP to make sure it was ok to take it she stated it was fine for her to take the Miralax. Patient wanted to know if she could mix it with Prune juice I informed her it was ok to mix it with it. I informed patient if she doesn't get any results to contact us back. Patient voiced full understanding and had no further questions at this time.

## 2023-08-05 ENCOUNTER — Other Ambulatory Visit: Payer: Self-pay

## 2023-08-05 DIAGNOSIS — Z170 Estrogen receptor positive status [ER+]: Secondary | ICD-10-CM

## 2023-08-05 DIAGNOSIS — D500 Iron deficiency anemia secondary to blood loss (chronic): Secondary | ICD-10-CM

## 2023-08-06 ENCOUNTER — Inpatient Hospital Stay

## 2023-08-06 ENCOUNTER — Inpatient Hospital Stay (HOSPITAL_BASED_OUTPATIENT_CLINIC_OR_DEPARTMENT_OTHER): Payer: BC Managed Care – PPO | Admitting: Hematology

## 2023-08-06 ENCOUNTER — Other Ambulatory Visit: Payer: BC Managed Care – PPO

## 2023-08-06 VITALS — BP 103/60 | HR 82 | Temp 98.5°F | Resp 16

## 2023-08-06 VITALS — BP 118/70 | HR 108 | Temp 98.0°F | Resp 20 | Ht 62.0 in | Wt 111.7 lb

## 2023-08-06 DIAGNOSIS — Z170 Estrogen receptor positive status [ER+]: Secondary | ICD-10-CM

## 2023-08-06 DIAGNOSIS — Z5111 Encounter for antineoplastic chemotherapy: Secondary | ICD-10-CM | POA: Diagnosis not present

## 2023-08-06 DIAGNOSIS — C50412 Malignant neoplasm of upper-outer quadrant of left female breast: Secondary | ICD-10-CM | POA: Diagnosis not present

## 2023-08-06 DIAGNOSIS — D500 Iron deficiency anemia secondary to blood loss (chronic): Secondary | ICD-10-CM

## 2023-08-06 LAB — CBC WITH DIFFERENTIAL (CANCER CENTER ONLY)
Abs Immature Granulocytes: 3.94 10*3/uL — ABNORMAL HIGH (ref 0.00–0.07)
Basophils Absolute: 0 10*3/uL (ref 0.0–0.1)
Basophils Relative: 0 %
Eosinophils Absolute: 0 10*3/uL (ref 0.0–0.5)
Eosinophils Relative: 0 %
HCT: 30.9 % — ABNORMAL LOW (ref 36.0–46.0)
Hemoglobin: 9.5 g/dL — ABNORMAL LOW (ref 12.0–15.0)
Immature Granulocytes: 16 %
Lymphocytes Relative: 6 %
Lymphs Abs: 1.5 10*3/uL (ref 0.7–4.0)
MCH: 19.6 pg — ABNORMAL LOW (ref 26.0–34.0)
MCHC: 30.7 g/dL (ref 30.0–36.0)
MCV: 63.7 fL — ABNORMAL LOW (ref 80.0–100.0)
Monocytes Absolute: 3.2 10*3/uL — ABNORMAL HIGH (ref 0.1–1.0)
Monocytes Relative: 13 %
Neutro Abs: 16.2 10*3/uL — ABNORMAL HIGH (ref 1.7–7.7)
Neutrophils Relative %: 65 %
Platelet Count: 104 10*3/uL — ABNORMAL LOW (ref 150–400)
RBC: 4.85 MIL/uL (ref 3.87–5.11)
RDW: 19.7 % — ABNORMAL HIGH (ref 11.5–15.5)
Smear Review: NORMAL
WBC Count: 24.8 10*3/uL — ABNORMAL HIGH (ref 4.0–10.5)
nRBC: 0.7 % — ABNORMAL HIGH (ref 0.0–0.2)

## 2023-08-06 LAB — CMP (CANCER CENTER ONLY)
ALT: 107 U/L — ABNORMAL HIGH (ref 0–44)
AST: 47 U/L — ABNORMAL HIGH (ref 15–41)
Albumin: 3.9 g/dL (ref 3.5–5.0)
Alkaline Phosphatase: 135 U/L — ABNORMAL HIGH (ref 38–126)
Anion gap: 4 — ABNORMAL LOW (ref 5–15)
BUN: 8 mg/dL (ref 6–20)
CO2: 28 mmol/L (ref 22–32)
Calcium: 8.9 mg/dL (ref 8.9–10.3)
Chloride: 108 mmol/L (ref 98–111)
Creatinine: 0.5 mg/dL (ref 0.44–1.00)
GFR, Estimated: >60 mL/min (ref >60)
Glucose, Bld: 92 mg/dL (ref 70–99)
Potassium: 3.6 mmol/L (ref 3.5–5.1)
Sodium: 140 mmol/L (ref 135–145)
Total Bilirubin: 0.4 mg/dL (ref 0.0–1.2)
Total Protein: 6.2 g/dL — ABNORMAL LOW (ref 6.5–8.1)

## 2023-08-06 LAB — IRON AND IRON BINDING CAPACITY (CC-WL,HP ONLY)
Iron: 97 ug/dL (ref 28–170)
Saturation Ratios: 31 % (ref 10.4–31.8)
TIBC: 318 ug/dL (ref 250–450)
UIBC: 221 ug/dL (ref 148–442)

## 2023-08-06 LAB — FERRITIN: Ferritin: 230 ng/mL (ref 11–307)

## 2023-08-06 MED ORDER — LORATADINE 10 MG PO TABS
10.0000 mg | ORAL_TABLET | Freq: Once | ORAL | Status: AC
  Administered 2023-08-06: 10 mg via ORAL
  Filled 2023-08-06: qty 1

## 2023-08-06 MED ORDER — SODIUM CHLORIDE 0.9% FLUSH
10.0000 mL | Freq: Once | INTRAVENOUS | Status: AC | PRN
  Administered 2023-08-06: 10 mL

## 2023-08-06 MED ORDER — HEPARIN SOD (PORK) LOCK FLUSH 100 UNIT/ML IV SOLN
500.0000 [IU] | Freq: Once | INTRAVENOUS | Status: AC | PRN
  Administered 2023-08-06: 500 [IU]

## 2023-08-06 MED ORDER — SODIUM CHLORIDE 0.9 % IV SOLN
510.0000 mg | Freq: Once | INTRAVENOUS | Status: AC
  Administered 2023-08-06: 510 mg via INTRAVENOUS
  Filled 2023-08-06: qty 510

## 2023-08-06 MED ORDER — SODIUM CHLORIDE 0.9 % IV SOLN
INTRAVENOUS | Status: DC
Start: 2023-08-06 — End: 2023-08-06

## 2023-08-06 NOTE — Assessment & Plan Note (Signed)
 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 Underwent lumpectomy on May 24, 2023. Tumor size 2.4 cm. Sentinel lymph node biopsy attempted but no sentinel lymph nodes found. Family history of breast cancer in a cousin.  -Her biopsy slides for stains and reviewed in our pathology lab, which confirmed invasive ductal carcinoma, and a DCIS.  Grade 2.  ER 40% with moderate staining, PR 40% moderate to strong staining, HER2 (-). 2 SLN were negative  -Oncotype RS 33, with 21% predicted risk of distant recurrence with tamoxifen in the next 9 years.  Adjuvant chemotherapy is recommended -I also discussed the clinical trial option with her  -She has met our surgeon Dr. Dwain Sarna and had port placed  -she started adjuvant chemotherapy TC every 3 weeks for 4 cycles on 07/28/2023

## 2023-08-06 NOTE — Patient Instructions (Signed)

## 2023-08-06 NOTE — Progress Notes (Signed)
Pt observed for 30 minutes post Feraheme infusion. Pt tolerated Tx well w/out incident. VSS at discharge.  Ambulatory to lobby.

## 2023-08-07 ENCOUNTER — Encounter: Payer: Self-pay | Admitting: Hematology

## 2023-08-07 NOTE — Progress Notes (Signed)
 [facility]   Telephone:([phone] Fax:(336) [phone]   Clinic Follow up Note   Patient Care Team: Salli Real, MD as PCP - General (Internal Medicine) Pershing Proud, RN as Oncology Nurse Navigator Donnelly Angelica, RN as Oncology Nurse Navigator Malachy Mood, MD as Consulting Physician (Hematology)  Date of Service:  08/06/2023  CHIEF COMPLAINT: f/u of breast cancer  CURRENT THERAPY:  Adjuvant chemotherapy TC  Oncology History   Malignant neoplasm of upper-outer quadrant of left breast in female, estrogen receptor positive (HCC) pT2N0M0, stage IA, ER+/PR+/HER2- 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 Underwent lumpectomy on May 24, 2023. Tumor size 2.4 cm. Sentinel lymph node biopsy attempted but no sentinel lymph nodes found. Family history of breast cancer in a cousin.  -Her biopsy slides for stains and reviewed in our pathology lab, which confirmed invasive ductal carcinoma, and a DCIS.  Grade 2.  ER 40% with moderate staining, PR 40% moderate to strong staining, HER2 (-). 2 SLN were negative  -Oncotype RS 33, with 21% predicted risk of distant recurrence with tamoxifen in the next 9 years.  Adjuvant chemotherapy is recommended -I also discussed the clinical trial option with her  -She has met our surgeon Dr. Dwain Sarna and had port placed  -she started adjuvant chemotherapy TC every 3 weeks for 4 cycles on 07/28/2023   Assessment and Plan    Breast cancer   Undergoing chemotherapy with a regimen of four cycles, each cycle every three weeks. Completed the first cycle with significant nausea and vomiting, common post-chemotherapy. Using a cold cap (Dignicap) to manage hair loss, which is uncomfortable due to tightness. Experiences bone pain post-injection of a white blood cell growth factor, expected to last a few days. Radiation therapy planned post-chemotherapy for four to six weeks, Monday  through Friday, with expected mild fatigue and skin reactions.   - Administer IV Emend for nausea in the next chemotherapy cycle.   - Adjust the cold cap for comfort if necessary.   - Continue chemotherapy regimen with three more cycles.   - Educate on the use of Claritin to manage bone pain and allergies.   - Ensure high protein intake to maintain weight.    Iron deficiency anemia   Confirmed by low hemoglobin (9.5 g/dL) and low platelet count (104 x 10^9/L). Oral iron supplements taken, but IV iron (Ferritin) to be administered today for the first time. Insurance approved treatment. IV iron is a large dose (500 mg) administered quickly within 30 minutes. Risk of allergic reactions, including rare severe reactions similar to penicillin allergy, with potential for anaphylaxis.   - Administer IV iron (Ferritin) 500 mg today.   - Monitor for allergic reactions during and after IV iron administration.   - Educate on signs of allergic reactions and instruct to report immediately if she occurs.   - Consider additional IV iron doses based on follow-up lab results.   - Discontinue oral iron temporarily.    Allergic rhinitis   Symptoms likely exacerbated by seasonal pollen. Claritin not taken regularly, which could help manage symptoms.   - Advise taking Claritin daily for allergic rhinitis symptoms.   - Educate on the benefits of Claritin for managing bone pain associated with white blood cell growth factor injections.    Plan -She tolerated the first cycle chemotherapy moderately well, side effect management reviewed again. -Will proceed Feraheme today -Lab and follow-up before cycle 2 chemotherapy in 2 weeks  SUMMARY OF ONCOLOGIC HISTORY: Oncology History  Malignant neoplasm of upper-outer quadrant of left breast in female, estrogen receptor positive (HCC)  05/24/2022 Surgery   Surgical pathology: Left lumpectomy, tumor size 2.3 x 2.0 x 1.8 cm, invasive ductal carcinoma with mucinous.   Grade 2,  (+) DCIS G2. (+).  Lymphovascular invasion (+), perineural invasion(-).  Surgical margins were negative.  No lymph nodes seen in the surgical sample.  ER 75% positive, moderate to strong staining, PR 20% positive, moderate staining, HER2 2+, HER2 negative by FISH.  Ki-67 40%   05/13/2023 Initial Biopsy   Biopsy of left breast mass: invasive ductal carcinoma,   IHC: ER +90% strong staining, PR 30% positive, moderate staining, HER2 2+, Ki-67 25%, e-cadherin (+)  Left axillary node biopsy: negative for malignant cells    05/21/2023 Imaging   Mammogram showed a irregular 2.2 x 2.0 cm mass in the upper outer quadrant of left breast, 3.3 cm from nipple, with irregular border and calcification, BI-RAD 4b, biopsy recommended.  In the right breast, there is a 1 cm nodule, likely benign, and scattered calcification, likely benign.    05/24/2023 Cancer Staging   Staging form: Breast, AJCC 8th Edition - Pathologic stage from 05/24/2023: Stage IA (pT2, pN0, cM0, G2, ER+, PR+, HER2-) - Signed by Malachy Mood, MD on 06/16/2023 Histologic grading system: 3 grade system Residual tumor (R): R0 - None   06/16/2023 Initial Diagnosis   Malignant neoplasm of upper-outer quadrant of left breast in female, estrogen receptor positive (HCC)   07/29/2023 -  Chemotherapy   Patient is on Treatment Plan : BREAST TC q21d        Discussed the use of AI scribe software for clinical note transcription with the patient, who gave verbal consent to proceed.  History of Present Illness   She reports experiencing nausea and vomiting, particularly in the initial stages of treatment. Despite these side effects, she states that her overall condition is manageable.         All other systems were reviewed with the patient and are negative.  MEDICAL HISTORY:  Past Medical History:  Diagnosis Date   Cancer (HCC) 07/10/2023   Breast    SURGICAL HISTORY: Past Surgical History:  Procedure Laterality Date   APPENDECTOMY      BREAST LUMPECTOMY Left 05/24/2023   PORTACATH PLACEMENT Right 07/17/2023   Procedure: INSERTION PORT-A-CATH WITH ULTRASOUND GUIDANCE;  Surgeon: [name], MD;  Location: [facility];  Service: General;  Laterality: Right;    I have reviewed the social history and family history with the patient and they are unchanged from previous note.  ALLERGIES:  has no known allergies.  MEDICATIONS:  Current Outpatient Medications  Medication Sig Dispense Refill   dexamethasone (DECADRON) 4 MG tablet Take 2 tabs by mouth 2 times daily starting day before chemo. Then take 2 tabs daily for 2 days starting day after chemo. Take with food. 30 tablet 1   lidocaine-prilocaine (EMLA) cream Apply to affected area once 30 g 3   LORazepam (ATIVAN) 1 MG tablet Take 1 tablet (1 mg total) by mouth every 8 (eight) hours as needed (severe nausea). 10 tablet 0   ondansetron (ZOFRAN) 8 MG tablet Take 1 tablet (8 mg total) by mouth every 8 (eight) hours as needed for nausea or vomiting. Start on the third day after chemotherapy. 30 tablet 1   prochlorperazine (COMPAZINE) 10 MG tablet Take 1 tablet (10 mg total) by mouth every 6 (six) hours as needed  for nausea or vomiting. 30 tablet 1   No current facility-administered medications for this visit.    PHYSICAL EXAMINATION: ECOG PERFORMANCE STATUS: 1 - Symptomatic but completely ambulatory  Vitals:   08/06/23 1308  BP: 118/70  Pulse: (!) 108  Resp: 20  Temp: 98 F (36.7 C)  SpO2: 98%   Wt Readings from Last 3 Encounters:  08/06/23 111 lb 11.2 oz (50.7 kg)  07/28/23 111 lb 11.2 oz (50.7 kg)  07/17/23 111 lb 5.3 oz (50.5 kg)     GENERAL:alert, no distress and comfortable SKIN: skin color, texture, turgor are normal, no rashes or significant lesions EYES: normal, Conjunctiva are pink and non-injected, sclera clear Musculoskeletal:no cyanosis of digits and no clubbing  NEURO: alert & oriented x 3 with fluent speech, no focal  motor/sensory deficits   LABORATORY DATA:  I have reviewed the data as listed    Latest Ref Rng & Units 08/06/2023   12:52 PM 07/28/2023    1:23 PM 04/04/2015    9:42 AM  CBC  WBC 4.0 - 10.5 K/uL 24.8  4.5  7.3   Hemoglobin 12.0 - 15.0 g/dL 9.5  04.5  40.9   Hematocrit 36.0 - 46.0 % 30.9  34.9  37.6   Platelets 150 - 400 K/uL 104  155  206         Latest Ref Rng & Units 08/06/2023   12:52 PM 07/28/2023    1:23 PM 04/04/2015    9:42 AM  CMP  Glucose 70 - 99 mg/dL 92  811  80   BUN 6 - 20 mg/dL 8  15  11    Creatinine 0.44 - 1.00 mg/dL 9.14  7.82  9.56   Sodium 135 - 145 mmol/L 140  139  135   Potassium 3.5 - 5.1 mmol/L 3.6  3.9  3.8   Chloride 98 - 111 mmol/L 108  108  102   CO2 22 - 32 mmol/L 28  26  24    Calcium 8.9 - 10.3 mg/dL 8.9  8.6  9.1   Total Protein 6.5 - 8.1 g/dL 6.2  6.7  6.6   Total Bilirubin 0.0 - 1.2 mg/dL 0.4  0.3  0.7   Alkaline Phos 38 - 126 U/L 135  57  61   AST 15 - 41 U/L 47  23  13   ALT 0 - 44 U/L 107  36  10       RADIOGRAPHIC STUDIES: I have personally reviewed the radiological images as listed and agreed with the findings in the report. No results found.    No orders of the defined types were placed in this encounter.  All questions were answered. The patient knows to call the clinic with any problems, questions or concerns. No barriers to learning was detected. The total time spent in the appointment was 25 minutes.     Malachy Mood, MD 08/06/2023

## 2023-08-18 MED FILL — Fosaprepitant Dimeglumine For IV Infusion 150 MG (Base Eq): INTRAVENOUS | Qty: 5 | Status: AC

## 2023-08-18 NOTE — Assessment & Plan Note (Signed)
 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 Underwent lumpectomy on May 24, 2023. Tumor size 2.4 cm. Sentinel lymph node biopsy attempted but no sentinel lymph nodes found. Family history of breast cancer in a cousin.  -Her biopsy slides for stains and reviewed in our pathology lab, which confirmed invasive ductal carcinoma, and a DCIS.  Grade 2.  ER 40% with moderate staining, PR 40% moderate to strong staining, HER2 (-). 2 SLN were negative  -Oncotype RS 33, with 21% predicted risk of distant recurrence with tamoxifen in the next 9 years.  Adjuvant chemotherapy is recommended -I also discussed the clinical trial option with her  -She has met our surgeon Dr. Dwain Sarna and had port placed  -she started adjuvant chemotherapy TC every 3 weeks for 4 cycles on 07/28/2023

## 2023-08-19 ENCOUNTER — Inpatient Hospital Stay: Payer: BC Managed Care – PPO | Attending: Hematology | Admitting: Hematology

## 2023-08-19 ENCOUNTER — Other Ambulatory Visit: Payer: BC Managed Care – PPO

## 2023-08-19 ENCOUNTER — Encounter: Payer: Self-pay | Admitting: *Deleted

## 2023-08-19 ENCOUNTER — Inpatient Hospital Stay

## 2023-08-19 ENCOUNTER — Inpatient Hospital Stay: Payer: BC Managed Care – PPO

## 2023-08-19 ENCOUNTER — Encounter: Payer: Self-pay | Admitting: Hematology

## 2023-08-19 VITALS — BP 105/60 | HR 71 | Temp 97.9°F | Resp 16 | Wt 111.4 lb

## 2023-08-19 VITALS — BP 100/63 | HR 68

## 2023-08-19 DIAGNOSIS — Z170 Estrogen receptor positive status [ER+]: Secondary | ICD-10-CM

## 2023-08-19 DIAGNOSIS — D500 Iron deficiency anemia secondary to blood loss (chronic): Secondary | ICD-10-CM

## 2023-08-19 DIAGNOSIS — Z5189 Encounter for other specified aftercare: Secondary | ICD-10-CM | POA: Diagnosis not present

## 2023-08-19 DIAGNOSIS — C50412 Malignant neoplasm of upper-outer quadrant of left female breast: Secondary | ICD-10-CM | POA: Diagnosis not present

## 2023-08-19 DIAGNOSIS — Z5111 Encounter for antineoplastic chemotherapy: Secondary | ICD-10-CM | POA: Insufficient documentation

## 2023-08-19 DIAGNOSIS — Z1732 Human epidermal growth factor receptor 2 negative status: Secondary | ICD-10-CM | POA: Insufficient documentation

## 2023-08-19 DIAGNOSIS — Z95828 Presence of other vascular implants and grafts: Secondary | ICD-10-CM | POA: Insufficient documentation

## 2023-08-19 LAB — CBC WITH DIFFERENTIAL (CANCER CENTER ONLY)
Abs Immature Granulocytes: 0.02 10*3/uL (ref 0.00–0.07)
Basophils Absolute: 0 10*3/uL (ref 0.0–0.1)
Basophils Relative: 1 %
Eosinophils Absolute: 0 10*3/uL (ref 0.0–0.5)
Eosinophils Relative: 1 %
HCT: 32.9 % — ABNORMAL LOW (ref 36.0–46.0)
Hemoglobin: 10 g/dL — ABNORMAL LOW (ref 12.0–15.0)
Immature Granulocytes: 1 %
Lymphocytes Relative: 24 %
Lymphs Abs: 0.8 10*3/uL (ref 0.7–4.0)
MCH: 20 pg — ABNORMAL LOW (ref 26.0–34.0)
MCHC: 30.4 g/dL (ref 30.0–36.0)
MCV: 65.9 fL — ABNORMAL LOW (ref 80.0–100.0)
Monocytes Absolute: 0.4 10*3/uL (ref 0.1–1.0)
Monocytes Relative: 11 %
Neutro Abs: 2.1 10*3/uL (ref 1.7–7.7)
Neutrophils Relative %: 62 %
Platelet Count: 137 10*3/uL — ABNORMAL LOW (ref 150–400)
RBC: 4.99 MIL/uL (ref 3.87–5.11)
RDW: 24.3 % — ABNORMAL HIGH (ref 11.5–15.5)
WBC Count: 3.3 10*3/uL — ABNORMAL LOW (ref 4.0–10.5)
nRBC: 0 % (ref 0.0–0.2)

## 2023-08-19 LAB — IRON AND IRON BINDING CAPACITY (CC-WL,HP ONLY)
Iron: 95 ug/dL (ref 28–170)
Saturation Ratios: 31 % (ref 10.4–31.8)
TIBC: 309 ug/dL (ref 250–450)
UIBC: 214 ug/dL (ref 148–442)

## 2023-08-19 LAB — CMP (CANCER CENTER ONLY)
ALT: 25 U/L (ref 0–44)
AST: 16 U/L (ref 15–41)
Albumin: 4 g/dL (ref 3.5–5.0)
Alkaline Phosphatase: 58 U/L (ref 38–126)
Anion gap: 5 (ref 5–15)
BUN: 11 mg/dL (ref 6–20)
CO2: 28 mmol/L (ref 22–32)
Calcium: 8.8 mg/dL — ABNORMAL LOW (ref 8.9–10.3)
Chloride: 109 mmol/L (ref 98–111)
Creatinine: 0.59 mg/dL (ref 0.44–1.00)
GFR, Estimated: >60 mL/min (ref >60)
Glucose, Bld: 79 mg/dL (ref 70–99)
Potassium: 3.8 mmol/L (ref 3.5–5.1)
Sodium: 142 mmol/L (ref 135–145)
Total Bilirubin: 0.4 mg/dL (ref 0.0–1.2)
Total Protein: 6 g/dL — ABNORMAL LOW (ref 6.5–8.1)

## 2023-08-19 LAB — PREGNANCY, URINE: Preg Test, Ur: NEGATIVE

## 2023-08-19 LAB — FERRITIN: Ferritin: 346 ng/mL — ABNORMAL HIGH (ref 11–307)

## 2023-08-19 MED ORDER — SODIUM CHLORIDE 0.9% FLUSH
10.0000 mL | INTRAVENOUS | Status: DC | PRN
  Administered 2023-08-19: 10 mL

## 2023-08-19 MED ORDER — PALONOSETRON HCL INJECTION 0.25 MG/5ML
0.2500 mg | Freq: Once | INTRAVENOUS | Status: AC
Start: 2023-08-19 — End: 2023-08-19
  Administered 2023-08-19: 0.25 mg via INTRAVENOUS
  Filled 2023-08-19: qty 5

## 2023-08-19 MED ORDER — SODIUM CHLORIDE 0.9 % IV SOLN
75.0000 mg/m2 | Freq: Once | INTRAVENOUS | Status: AC
  Administered 2023-08-19: 112 mg via INTRAVENOUS
  Filled 2023-08-19: qty 11.2

## 2023-08-19 MED ORDER — DEXAMETHASONE SODIUM PHOSPHATE 10 MG/ML IJ SOLN
10.0000 mg | Freq: Once | INTRAMUSCULAR | Status: AC
  Administered 2023-08-19: 10 mg via INTRAVENOUS
  Filled 2023-08-19: qty 1

## 2023-08-19 MED ORDER — HEPARIN SOD (PORK) LOCK FLUSH 100 UNIT/ML IV SOLN
500.0000 [IU] | Freq: Once | INTRAVENOUS | Status: AC | PRN
  Administered 2023-08-19: 500 [IU]

## 2023-08-19 MED ORDER — SODIUM CHLORIDE 0.9 % IV SOLN
INTRAVENOUS | Status: DC
Start: 2023-08-19 — End: 2023-08-19

## 2023-08-19 MED ORDER — SODIUM CHLORIDE 0.9 % IV SOLN
150.0000 mg | Freq: Once | INTRAVENOUS | Status: AC
  Administered 2023-08-19: 150 mg via INTRAVENOUS
  Filled 2023-08-19: qty 150

## 2023-08-19 MED ORDER — SODIUM CHLORIDE 0.9 % IV SOLN
600.0000 mg/m2 | Freq: Once | INTRAVENOUS | Status: AC
  Administered 2023-08-19: 900 mg via INTRAVENOUS
  Filled 2023-08-19: qty 45

## 2023-08-19 NOTE — Progress Notes (Signed)
 Pt has decided to not continue with dignicap.

## 2023-08-19 NOTE — Progress Notes (Signed)
 Pt reported dizziness upon standing for discharge. Orthostatic vitals taken and stable. Dr. Mosetta Putt at bedside, ok to discharge home. Pt was discharged to lobby in a wheelchair with spouse driving her home.

## 2023-08-19 NOTE — Patient Instructions (Signed)
 [facility] - A DEPT OF [facility]  Discharge Instructions: Thank you for choosing [facility] to provide your oncology and hematology care.   If you have a lab appointment with the Cancer Center, please go directly to the Cancer Center and check in at the registration area.   Wear comfortable clothing and clothing appropriate for easy access to any Portacath or PICC line.   We strive to give you quality time with your provider. You may need to reschedule your appointment if you arrive late (15 or more minutes).  Arriving late affects you and other patients whose appointments are after yours.  Also, if you miss three or more appointments without notifying the office, you may be dismissed from the clinic at the provider's discretion.      For prescription refill requests, have your pharmacy contact our office and allow 72 hours for refills to be completed.    Today you received the following chemotherapy and/or immunotherapy agents docetaxel and cytoxan      To help prevent nausea and vomiting after your treatment, we encourage you to take your nausea medication as directed.  BELOW ARE SYMPTOMS THAT SHOULD BE REPORTED IMMEDIATELY: *FEVER GREATER THAN 100.4 F (38 C) OR HIGHER *CHILLS OR SWEATING *NAUSEA AND VOMITING THAT IS NOT CONTROLLED WITH YOUR NAUSEA MEDICATION *UNUSUAL SHORTNESS OF BREATH *UNUSUAL BRUISING OR BLEEDING *URINARY PROBLEMS (pain or burning when urinating, or frequent urination) *BOWEL PROBLEMS (unusual diarrhea, constipation, pain near the anus) TENDERNESS IN MOUTH AND THROAT WITH OR WITHOUT PRESENCE OF ULCERS (sore throat, sores in mouth, or a toothache) UNUSUAL RASH, SWELLING OR PAIN  UNUSUAL VAGINAL DISCHARGE OR ITCHING   Items with * indicate a potential emergency and should be followed up as soon as possible or go to the Emergency Department if any problems should occur.  Please show the CHEMOTHERAPY ALERT CARD or  IMMUNOTHERAPY ALERT CARD at check-in to the Emergency Department and triage nurse.  Should you have questions after your visit or need to cancel or reschedule your appointment, please contact [facility] - A DEPT OF [facility]  Dept: [phone]  and follow the prompts.  Office hours are 8:00 a.m. to 4:30 p.m. Monday - Friday. Please note that voicemails left after 4:00 p.m. may not be returned until the following business day.  We are closed weekends and major holidays. You have access to a nurse at all times for urgent questions. Please call the main number to the clinic Dept: [phone] and follow the prompts.   For any non-urgent questions, you may also contact your provider using MyChart. We now offer e-Visits for anyone 31 and older to request care online for non-urgent symptoms. For details visit [email or web address].   Also download the MyChart app! Go to the app store, search "MyChart", open the app, select [place], and log in with your MyChart username and password.

## 2023-08-19 NOTE — Progress Notes (Signed)
 [facility]   Telephone:(336) [phone] Fax:(336) [phone]   Clinic Follow up Note   Patient Care Team: Salli Real, MD as PCP - General (Internal Medicine) Pershing Proud, RN as Oncology Nurse Navigator Donnelly Angelica, RN as Oncology Nurse Navigator Malachy Mood, MD as Consulting Physician (Hematology)  Date of Service:  08/19/2023  CHIEF COMPLAINT: f/u of breast cancer  CURRENT THERAPY:  Adjuvant chemotherapy TC  Oncology History   Malignant neoplasm of upper-outer quadrant of left breast in female, estrogen receptor positive (HCC) pT2N0M0, stage IA, ER+/PR+/HER2- 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 Underwent lumpectomy on May 24, 2023. Tumor size 2.4 cm. Sentinel lymph node biopsy attempted but no sentinel lymph nodes found. Family history of breast cancer in a cousin.  -Her biopsy slides for stains and reviewed in our pathology lab, which confirmed invasive ductal carcinoma, and a DCIS.  Grade 2.  ER 40% with moderate staining, PR 40% moderate to strong staining, HER2 (-). 2 SLN were negative  -Oncotype RS 33, with 21% predicted risk of distant recurrence with tamoxifen in the next 9 years.  Adjuvant chemotherapy is recommended -I also discussed the clinical trial option with her  -She has met our surgeon Dr. Dwain Sarna and had port placed  -she started adjuvant chemotherapy TC every 3 weeks for 4 cycles on 07/28/2023  Assessment and Plan    Breast cancer Undergoing adjuvant chemotherapy to reduce her risk of recurrence. Experienced an atypical rash two weeks post-chemotherapy, managed with Benadryl. Forgot to take dexamethasone, advised to extend its use by one day. Nausea and vomiting occurred on chemotherapy day, resolved quickly. Pain post-white blood cell growth factor injection managed with Tylenol. Slightly lower blood pressure, not concerning. Previously elevated liver function tests,  monitored due to chemotherapy. - Administer chemotherapy as scheduled. - Advise taking dexamethasone for an additional day to prevent chemotherapy reactions. - Monitor liver function tests and blood counts. - Proceed with the second cycle of chemotherapy.  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 Unsatisfied with a wig purchased from [place], interested in obtaining a new one. - Coordinate with the nurse to arrange a consultation for a wig fitting. - Provide a diagnosis note for insurance coverage of a wig.  Thalassemia trait Microcytic anemia with family history suggestive of thalassemia. Red blood cell size smaller than typical iron deficiency anemia, indicating possible thalassemia trait. No significant anemia symptoms, suggesting carrier status. - Consider genetic testing for thalassemia to confirm carrier status. - Monitor hemoglobin and red blood cell indices.  Allergic rhinitis Symptoms consistent with allergic rhinitis, including cough and throat itchiness, likely due to pollen exposure. Symptoms have resolved. - Monitor for recurrence of symptoms and manage with antihistamines if needed.  Plan -Lab reviewed, adequate for treatment, will proceed cycle 2 chemo TC today -Follow-up in 3 weeks before cycle 3 chemo Advised to follow up as needed before next appointment and contact provider for urgent issues. Instructed on using MyChart for non-urgent communication.      SUMMARY OF ONCOLOGIC HISTORY: Oncology History  Malignant neoplasm of upper-outer quadrant of left breast in female, estrogen receptor positive (HCC)  05/24/2022 Surgery   Surgical pathology: Left lumpectomy, tumor size 2.3 x 2.0 x 1.8 cm, invasive ductal carcinoma with mucinous.  Grade 2,  (+) DCIS G2. (+).  Lymphovascular invasion (+), perineural invasion(-).  Surgical margins were negative.  No lymph nodes seen in the surgical  sample.  ER 75% positive, moderate to strong  staining, PR 20% positive, moderate staining, HER2 2+, HER2 negative by FISH.  Ki-67 40%   05/13/2023 Initial Biopsy   Biopsy of left breast mass: invasive ductal carcinoma,   IHC: ER +90% strong staining, PR 30% positive, moderate staining, HER2 2+, Ki-67 25%, e-cadherin (+)  Left axillary node biopsy: negative for malignant cells    05/21/2023 Imaging   Mammogram showed a irregular 2.2 x 2.0 cm mass in the upper outer quadrant of left breast, 3.3 cm from nipple, with irregular border and calcification, BI-RAD 4b, biopsy recommended.  In the right breast, there is a 1 cm nodule, likely benign, and scattered calcification, likely benign.    05/24/2023 Cancer Staging   Staging form: Breast, AJCC 8th Edition - Pathologic stage from 05/24/2023: Stage IA (pT2, pN0, cM0, G2, ER+, PR+, HER2-) - Signed by Malachy Mood, MD on 06/16/2023 Histologic grading system: 3 grade system Residual tumor (R): R0 - None   06/16/2023 Initial Diagnosis   Malignant neoplasm of upper-outer quadrant of left breast in female, estrogen receptor positive (HCC)   07/29/2023 -  Chemotherapy   Patient is on Treatment Plan : BREAST TC q21d        Discussed the use of AI scribe software for clinical note transcription with the patient, who gave verbal consent to proceed.  History of Present Illness   A 43 year old female with a history of breast cancer is currently undergoing chemotherapy. She reports hair loss, which is a common side effect of her treatment. She also experienced a rash, which was successfully treated with Benadryl. The patient occasionally experiences dizziness, but it is unclear whether this is related to her chemotherapy or another underlying condition. She admits to forgetting to take her dexamethasone, a medication prescribed to prevent chemotherapy side effects. The patient's blood work shows a low red blood cell size, raising the possibility of thalassemia, a blood disorder. She also reports a recent low blood  pressure reading.         All other systems were reviewed with the patient and are negative.  MEDICAL HISTORY:  Past Medical History:  Diagnosis Date   Cancer (HCC) 07/10/2023   Breast    SURGICAL HISTORY: Past Surgical History:  Procedure Laterality Date   APPENDECTOMY     BREAST LUMPECTOMY Left 05/24/2023   PORTACATH PLACEMENT Right 07/17/2023   Procedure: INSERTION PORT-A-CATH WITH ULTRASOUND GUIDANCE;  Surgeon: [name], MD;  Location: [facility];  Service: General;  Laterality: Right;    I have reviewed the social history and family history with the patient and they are unchanged from previous note.  ALLERGIES:  has no known allergies.  MEDICATIONS:  Current Outpatient Medications  Medication Sig Dispense Refill   dexamethasone (DECADRON) 4 MG tablet Take 2 tabs by mouth 2 times daily starting day before chemo. Then take 2 tabs daily for 2 days starting day after chemo. Take with food. 30 tablet 1   lidocaine-prilocaine (EMLA) cream Apply to affected area once 30 g 3   LORazepam (ATIVAN) 1 MG tablet Take 1 tablet (1 mg total) by mouth every 8 (eight) hours as needed (severe nausea). 10 tablet 0   ondansetron (ZOFRAN) 8 MG tablet Take 1 tablet (8 mg total) by mouth every 8 (eight) hours as needed for nausea or vomiting. Start on the third day after chemotherapy. 30 tablet 1   prochlorperazine (COMPAZINE) 10 MG tablet Take 1 tablet (10 mg total) by mouth  every 6 (six) hours as needed for nausea or vomiting. 30 tablet 1   No current facility-administered medications for this visit.   Facility-Administered Medications Ordered in Other Visits  Medication Dose Route Frequency Provider Last Rate Last Admin   0.9 %  sodium chloride infusion   Intravenous Continuous Malachy Mood, MD   Stopped at 08/19/23 1331   sodium chloride flush (NS) 0.9 % injection 10 mL  10 mL Intracatheter PRN Malachy Mood, MD   10 mL at 08/19/23 1332    PHYSICAL EXAMINATION: ECOG  PERFORMANCE STATUS: 1 - Symptomatic but completely ambulatory  Vitals:   08/19/23 0921  BP: 105/60  Pulse: 71  Resp: 16  Temp: 97.9 F (36.6 C)  SpO2: 100%   Wt Readings from Last 3 Encounters:  08/19/23 111 lb 6.4 oz (50.5 kg)  08/06/23 111 lb 11.2 oz (50.7 kg)  07/28/23 111 lb 11.2 oz (50.7 kg)     GENERAL:alert, no distress and comfortable SKIN: skin color, texture, turgor are normal, no rashes or significant lesions EYES: normal, Conjunctiva are pink and non-injected, sclera clear NECK: supple, thyroid normal size, non-tender, without nodularity LYMPH:  no palpable lymphadenopathy in the cervical, axillary  LUNGS: clear to auscultation and percussion with normal breathing effort HEART: regular rate & rhythm and no murmurs and no lower extremity edema ABDOMEN:abdomen soft, non-tender and normal bowel sounds Musculoskeletal:no cyanosis of digits and no clubbing  NEURO: alert & oriented x 3 with fluent speech, no focal motor/sensory deficits  LABORATORY DATA:  I have reviewed the data as listed    Latest Ref Rng & Units 08/19/2023    8:48 AM 08/06/2023   12:52 PM 07/28/2023    1:23 PM  CBC  WBC 4.0 - 10.5 K/uL 3.3  24.8  4.5   Hemoglobin 12.0 - 15.0 g/dL 09.8  9.5  11.9   Hematocrit 36.0 - 46.0 % 32.9  30.9  34.9   Platelets 150 - 400 K/uL 137  104  155         Latest Ref Rng & Units 08/19/2023    8:48 AM 08/06/2023   12:52 PM 07/28/2023    1:23 PM  CMP  Glucose 70 - 99 mg/dL 79  92  147   BUN 6 - 20 mg/dL 11  8  15    Creatinine 0.44 - 1.00 mg/dL 8.29  5.62  1.30   Sodium 135 - 145 mmol/L 142  140  139   Potassium 3.5 - 5.1 mmol/L 3.8  3.6  3.9   Chloride 98 - 111 mmol/L 109  108  108   CO2 22 - 32 mmol/L 28  28  26    Calcium 8.9 - 10.3 mg/dL 8.8  8.9  8.6   Total Protein 6.5 - 8.1 g/dL 6.0  6.2  6.7   Total Bilirubin 0.0 - 1.2 mg/dL 0.4  0.4  0.3   Alkaline Phos 38 - 126 U/L 58  135  57   AST 15 - 41 U/L 16  47  23   ALT 0 - 44 U/L 25  107  36        RADIOGRAPHIC STUDIES: I have personally reviewed the radiological images as listed and agreed with the findings in the report. No results found.    No orders of the defined types were placed in this encounter.  All questions were answered. The patient knows to call the clinic with any problems, questions or concerns. No barriers to learning was detected. The  total time spent in the appointment was 30 minutes.     Malachy Mood, MD 08/19/2023

## 2023-08-21 ENCOUNTER — Inpatient Hospital Stay: Payer: BC Managed Care – PPO

## 2023-08-21 VITALS — BP 102/63 | HR 75 | Temp 99.1°F | Resp 16

## 2023-08-21 DIAGNOSIS — Z5111 Encounter for antineoplastic chemotherapy: Secondary | ICD-10-CM | POA: Diagnosis not present

## 2023-08-21 DIAGNOSIS — C50412 Malignant neoplasm of upper-outer quadrant of left female breast: Secondary | ICD-10-CM

## 2023-08-21 MED ORDER — PEGFILGRASTIM-JMDB 6 MG/0.6ML ~~LOC~~ SOSY
6.0000 mg | PREFILLED_SYRINGE | Freq: Once | SUBCUTANEOUS | Status: AC
  Administered 2023-08-21: 6 mg via SUBCUTANEOUS
  Filled 2023-08-21: qty 0.6

## 2023-08-24 ENCOUNTER — Telehealth: Payer: Self-pay

## 2023-08-24 NOTE — Telephone Encounter (Signed)
 Spoke with pt via telephone regarding prescription for prosthetic wig.  Instructed pt to contact her Insurance to confirm it's covered and how much money her insurance will pay for the prosthetic wig.  Instructed pt to give Dr. Latanya Maudlin office a call once she's obtained this information.  Pt verbalized understanding.  Notified Dr. Mosetta Putt of pt's agreement to prosthetic wig.  Dr. Mosetta Putt wrote written prescription for wig.  Pt is aware.

## 2023-09-08 ENCOUNTER — Other Ambulatory Visit: Payer: Self-pay | Admitting: Nurse Practitioner

## 2023-09-08 DIAGNOSIS — C50412 Malignant neoplasm of upper-outer quadrant of left female breast: Secondary | ICD-10-CM

## 2023-09-08 DIAGNOSIS — D500 Iron deficiency anemia secondary to blood loss (chronic): Secondary | ICD-10-CM

## 2023-09-08 MED FILL — Fosaprepitant Dimeglumine For IV Infusion 150 MG (Base Eq): INTRAVENOUS | Qty: 5 | Status: AC

## 2023-09-08 NOTE — Assessment & Plan Note (Addendum)
 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 Underwent lumpectomy on May 24, 2023. Tumor size 2.4 cm. Sentinel lymph node biopsy attempted but no sentinel lymph nodes found. Family history of breast cancer in a cousin.  -Her biopsy slides for stains and reviewed in our pathology lab, which confirmed invasive ductal carcinoma, and a DCIS.  Grade 2.  ER 40% with moderate staining, PR 40% moderate to strong staining, HER2 (-). 2 SLN were negative  -Oncotype RS 33, with 21% predicted risk of distant recurrence with tamoxifen in the next 9 years.  Adjuvant chemotherapy is recommended -I also discussed the clinical trial option with her  -She has met our surgeon Dr. Delane Fear and had port placed  -she started adjuvant chemotherapy TC every 3 weeks for 4 cycles on 07/28/2023 -09/09/2023 - proceed with Cycle 3 of adjuvant chemotherapy TC. Will lproceed with Cycle 4 and referral to radiation oncology at visit for cycle #4.  -Cycle 4 adjuvant chemotherapy TC in 3 weeks

## 2023-09-08 NOTE — Progress Notes (Signed)
 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 Underwent lumpectomy on May 24, 2023. Tumor size 2.4 cm. Sentinel lymph node biopsy attempted but no sentinel lymph nodes found. Family history of breast cancer in a cousin.  -Her biopsy slides for stains and reviewed in our pathology lab, which confirmed invasive ductal carcinoma, and a DCIS.  Grade 2.  ER 40% with moderate staining, PR 40% moderate to strong staining, HER2 (-). 2 SLN were negative  -Oncotype RS 33, with 21% predicted risk of distant recurrence with tamoxifen in the next 9 years.  Adjuvant chemotherapy is recommended -I also discussed the clinical trial option with her  -She has met our surgeon Dr. Delane Fear and had port placed  -she started adjuvant chemotherapy TC every 3 weeks for 4 cycles on 07/28/2023 -09/09/2023 - proceed with Cycle 3 of adjuvant chemotherapy TC. Will lproceed with Cycle 4 and referral to radiation oncology at visit for cycle #4.  -Cycle 4 adjuvant chemotherapy TC in 3 weeks    Mild anemia  Patient with known history of thalassemia trait. Today, anemia is stable and slightly improved with Hgb 10.4 and Hct 34.2. Ferritin pending. Ferritin level from 3 weeks ago was above normal at 346. Will treat with iv iron if indicated.   Plan: Reviewed labs. Mild and stable anemia. Glucose elevated at 224. Other labs unremarkable. Waiting on ferritin results. Prior level 346.  Proceed with Cycle 3 adjuvant  chemotherapy TC.  Prescription for wig and contact information given to patient with instructions regarding how to proceed.  Plan to proceed with labs and port flush, follow up and cycle 4 chemotherapy TC in 3 weeks.  Refer for radiation oncology at next visit.    The patient understands the plans discussed today and is in agreement with them.  She knows to contact our office if she develops concerns prior to her next appointment.  I provided 25 minutes of face-to-face time during this encounter and > 50% was spent counseling as documented under my assessment and plan.    Sharyon Deis, NP   [facility] - A DEPT OF [facility] [address] [place] [address] Dept: [phone] Dept Fax: [phone]   No orders of the defined types were placed in this encounter.     CHIEF COMPLAINT:  CC: left breast cancer, estrogen receptor positive   Current Treatment:  adjuvant chemotherapy TC every 3 weeks starting on 07/28/2023. Growth factor support given on day 3 of treatment   INTERVAL HISTORY:  Shenetta is here today for repeat clinical assessment. She last saw Dr. Maryalice Smaller 08/19/2023. Today, she presents for Cycle 3 day 1 adjuvant chemotherapy TC.  She states that this most recent cycle was slightly better than first.  Appetite better.  Less nausea and vomiting.  Less joint pain after growth factor treatment.  She is curious about testing after this cycle in hopes to avoid cycle #4.  She denies chest pain, chest pressure, or shortness of  breath. She denies headaches or visual disturbances. She denies abdominal pain, nausea, vomiting, or changes in bowel or bladder habits.  She denies fevers or chills. She denies pain. Her appetite is good. Her weight has been stable.  I have reviewed the past medical history, past surgical history, social history and family history with the patient and they are unchanged from previous  note.  ALLERGIES:  has no known allergies.  MEDICATIONS:  Current Outpatient Medications  Medication Sig Dispense Refill   dexamethasone  (DECADRON ) 4 MG tablet Take 2 tabs by mouth 2 times daily starting day before chemo. Then take 2 tabs daily for 2 days starting day after chemo. Take with food. 30 tablet 1   lidocaine -prilocaine  (EMLA ) cream Apply to affected area once 30 g 3   LORazepam  (ATIVAN ) 1 MG tablet Take 1 tablet (1 mg total) by mouth every 8 (eight) hours as needed (severe nausea). 10 tablet 0   ondansetron  (ZOFRAN ) 8 MG tablet Take 1 tablet (8 mg total) by mouth every 8 (eight) hours as needed for nausea or vomiting. Start on the third day after chemotherapy. 30 tablet 1   prochlorperazine  (COMPAZINE ) 10 MG tablet Take 1 tablet (10 mg total) by mouth every 6 (six) hours as needed for nausea or vomiting. 30 tablet 1   No current facility-administered medications for this visit.    HISTORY OF PRESENT ILLNESS:   Oncology History  Malignant neoplasm of upper-outer quadrant of left breast in female, estrogen receptor positive (HCC)  05/24/2022 Surgery   Surgical pathology: Left lumpectomy, tumor size 2.3 x 2.0 x 1.8 cm, invasive ductal carcinoma with mucinous.  Grade 2,  (+) DCIS G2. (+).  Lymphovascular invasion (+), perineural invasion(-).  Surgical margins were negative.  No lymph nodes seen in the surgical sample.  ER 75% positive, moderate to strong staining, PR 20% positive, moderate staining, HER2 2+, HER2 negative by FISH.  Ki-67 40%   05/13/2023 Initial Biopsy   Biopsy of left breast mass: invasive ductal carcinoma,   IHC: ER +90% strong staining, PR 30% positive, moderate staining, HER2 2+, Ki-67 25%, e-cadherin (+)  Left axillary node biopsy: negative for malignant cells    05/21/2023 Imaging   Mammogram showed a irregular 2.2 x 2.0 cm mass in the upper outer quadrant of left breast, 3.3 cm from nipple, with irregular border and calcification, BI-RAD 4b, biopsy  recommended.  In the right breast, there is a 1 cm nodule, likely benign, and scattered calcification, likely benign.    05/24/2023 Cancer Staging   Staging form: Breast, AJCC 8th Edition - Pathologic stage from 05/24/2023: Stage IA (pT2, pN0, cM0, G2, ER+, PR+, HER2-) - Signed by Sonja Oconee, MD on 06/16/2023 Histologic grading system: 3 grade system Residual tumor (R): R0 - None   06/16/2023 Initial Diagnosis   Malignant neoplasm of upper-outer quadrant of left breast in female, estrogen receptor positive (HCC)   07/29/2023 -  Chemotherapy   Patient is on Treatment Plan : BREAST TC q21d         REVIEW OF SYSTEMS:   Constitutional: Denies fevers, chills or abnormal weight loss Eyes: Denies blurriness of vision Ears, nose, mouth, throat, and face: Denies mucositis or sore throat Respiratory: Denies cough, dyspnea or wheezes Cardiovascular: Denies palpitation, chest discomfort or lower extremity swelling Gastrointestinal:  Denies nausea, heartburn or change in bowel habits Skin: Denies abnormal skin rashes Lymphatics: Denies new lymphadenopathy or easy bruising Neurological:Denies numbness, tingling or new weaknesses Behavioral/Psych: Mood is stable, no new changes  All other systems were reviewed with the patient and are negative.   VITALS:   Today's Vitals   09/09/23 0919 09/09/23 0920  BP: 108/60   Pulse: 80   Resp: 17   Temp: 98.2 F (36.8 C)   SpO2: 99%   Weight: 113 lb 1.6 oz (51.3 kg)   PainSc:  0-No pain   Body mass index is 20.69 kg/m.   Wt Readings from Last 3 Encounters:  09/09/23 113 lb 1.6 oz (51.3 kg)  08/19/23 111 lb 6.4 oz (50.5 kg)  08/06/23 111 lb 11.2 oz (50.7 kg)    Body mass index is 20.69 kg/m.  Performance status (ECOG): 1 - Symptomatic but completely ambulatory  PHYSICAL EXAM:   GENERAL:alert, no distress and comfortable SKIN: skin color, texture, turgor are normal, no rashes or significant lesions EYES: normal, Conjunctiva are pink and  non-injected, sclera clear OROPHARYNX:no exudate, no erythema and lips, buccal mucosa, and tongue normal  NECK: supple, thyroid normal size, non-tender, without nodularity LYMPH:  no palpable lymphadenopathy in the cervical, axillary or inguinal LUNGS: clear to auscultation and percussion with normal breathing effort HEART: regular rate & rhythm and no murmurs and no lower extremity edema ABDOMEN:abdomen soft, non-tender and normal bowel sounds Musculoskeletal:no cyanosis of digits and no clubbing  NEURO: alert & oriented x 3 with fluent speech, no focal motor/sensory deficits  LABORATORY DATA:  I have reviewed the data as listed    Component Value Date/Time   NA 140 09/09/2023 0829   K 3.8 09/09/2023 0829   CL 107 09/09/2023 0829   CO2 26 09/09/2023 0829   GLUCOSE 224 (H) 09/09/2023 0829   BUN 12 09/09/2023 0829   CREATININE 0.54 09/09/2023 0829   CREATININE 0.51 04/04/2015 0942   CALCIUM 9.4 09/09/2023 0829   PROT 6.6 09/09/2023 0829   ALBUMIN 4.4 09/09/2023 0829   AST 18 09/09/2023 0829   ALT 31 09/09/2023 0829   ALKPHOS 77 09/09/2023 0829   BILITOT 0.4 09/09/2023 0829   GFRNONAA >60 09/09/2023 0829     Lab Results  Component Value Date   WBC 8.6 09/09/2023   NEUTROABS 7.8 (H) 09/09/2023   HGB 10.4 (L) 09/09/2023   HCT 34.2 (L) 09/09/2023   MCV 67.5 (L) 09/09/2023   PLT 172 09/09/2023

## 2023-09-09 ENCOUNTER — Inpatient Hospital Stay: Payer: BC Managed Care – PPO | Admitting: Nurse Practitioner

## 2023-09-09 ENCOUNTER — Inpatient Hospital Stay (HOSPITAL_BASED_OUTPATIENT_CLINIC_OR_DEPARTMENT_OTHER)

## 2023-09-09 ENCOUNTER — Inpatient Hospital Stay: Payer: BC Managed Care – PPO

## 2023-09-09 ENCOUNTER — Other Ambulatory Visit: Payer: BC Managed Care – PPO

## 2023-09-09 VITALS — BP 108/60 | HR 80 | Temp 98.2°F | Resp 17 | Wt 113.1 lb

## 2023-09-09 DIAGNOSIS — Z5111 Encounter for antineoplastic chemotherapy: Secondary | ICD-10-CM | POA: Diagnosis not present

## 2023-09-09 DIAGNOSIS — C50412 Malignant neoplasm of upper-outer quadrant of left female breast: Secondary | ICD-10-CM

## 2023-09-09 DIAGNOSIS — Z170 Estrogen receptor positive status [ER+]: Secondary | ICD-10-CM

## 2023-09-09 DIAGNOSIS — Z95828 Presence of other vascular implants and grafts: Secondary | ICD-10-CM

## 2023-09-09 DIAGNOSIS — D500 Iron deficiency anemia secondary to blood loss (chronic): Secondary | ICD-10-CM

## 2023-09-09 LAB — CBC WITH DIFFERENTIAL (CANCER CENTER ONLY)
Abs Immature Granulocytes: 0.05 10*3/uL (ref 0.00–0.07)
Basophils Absolute: 0 10*3/uL (ref 0.0–0.1)
Basophils Relative: 0 %
Eosinophils Absolute: 0 10*3/uL (ref 0.0–0.5)
Eosinophils Relative: 0 %
HCT: 34.2 % — ABNORMAL LOW (ref 36.0–46.0)
Hemoglobin: 10.4 g/dL — ABNORMAL LOW (ref 12.0–15.0)
Immature Granulocytes: 1 %
Lymphocytes Relative: 6 %
Lymphs Abs: 0.5 10*3/uL — ABNORMAL LOW (ref 0.7–4.0)
MCH: 20.5 pg — ABNORMAL LOW (ref 26.0–34.0)
MCHC: 30.4 g/dL (ref 30.0–36.0)
MCV: 67.5 fL — ABNORMAL LOW (ref 80.0–100.0)
Monocytes Absolute: 0.2 10*3/uL (ref 0.1–1.0)
Monocytes Relative: 2 %
Neutro Abs: 7.8 10*3/uL — ABNORMAL HIGH (ref 1.7–7.7)
Neutrophils Relative %: 91 %
Platelet Count: 172 10*3/uL (ref 150–400)
RBC: 5.07 MIL/uL (ref 3.87–5.11)
RDW: 25.2 % — ABNORMAL HIGH (ref 11.5–15.5)
WBC Count: 8.6 10*3/uL (ref 4.0–10.5)
nRBC: 0 % (ref 0.0–0.2)

## 2023-09-09 LAB — CMP (CANCER CENTER ONLY)
ALT: 31 U/L (ref 0–44)
AST: 18 U/L (ref 15–41)
Albumin: 4.4 g/dL (ref 3.5–5.0)
Alkaline Phosphatase: 77 U/L (ref 38–126)
Anion gap: 7 (ref 5–15)
BUN: 12 mg/dL (ref 6–20)
CO2: 26 mmol/L (ref 22–32)
Calcium: 9.4 mg/dL (ref 8.9–10.3)
Chloride: 107 mmol/L (ref 98–111)
Creatinine: 0.54 mg/dL (ref 0.44–1.00)
GFR, Estimated: >60 mL/min (ref >60)
Glucose, Bld: 224 mg/dL — ABNORMAL HIGH (ref 70–99)
Potassium: 3.8 mmol/L (ref 3.5–5.1)
Sodium: 140 mmol/L (ref 135–145)
Total Bilirubin: 0.4 mg/dL (ref 0.0–1.2)
Total Protein: 6.6 g/dL (ref 6.5–8.1)

## 2023-09-09 LAB — FERRITIN: Ferritin: 224 ng/mL (ref 11–307)

## 2023-09-09 LAB — PREGNANCY, URINE: Preg Test, Ur: NEGATIVE

## 2023-09-09 MED ORDER — SODIUM CHLORIDE 0.9% FLUSH
10.0000 mL | INTRAVENOUS | Status: DC | PRN
  Administered 2023-09-09: 10 mL

## 2023-09-09 MED ORDER — DEXAMETHASONE SODIUM PHOSPHATE 10 MG/ML IJ SOLN
10.0000 mg | Freq: Once | INTRAMUSCULAR | Status: AC
  Administered 2023-09-09: 10 mg via INTRAVENOUS
  Filled 2023-09-09: qty 1

## 2023-09-09 MED ORDER — SODIUM CHLORIDE 0.9 % IV SOLN
600.0000 mg/m2 | Freq: Once | INTRAVENOUS | Status: AC
  Administered 2023-09-09: 900 mg via INTRAVENOUS
  Filled 2023-09-09: qty 45

## 2023-09-09 MED ORDER — SODIUM CHLORIDE 0.9 % IV SOLN: INTRAVENOUS | Status: DC

## 2023-09-09 MED ORDER — SODIUM CHLORIDE 0.9 % IV SOLN
150.0000 mg | Freq: Once | INTRAVENOUS | Status: AC
  Administered 2023-09-09: 150 mg via INTRAVENOUS
  Filled 2023-09-09: qty 150

## 2023-09-09 MED ORDER — DOCETAXEL CHEMO INJECTION 160 MG/16ML
75.0000 mg/m2 | Freq: Once | INTRAVENOUS | Status: AC
  Administered 2023-09-09: 112 mg via INTRAVENOUS
  Filled 2023-09-09: qty 11.2

## 2023-09-09 MED ORDER — PALONOSETRON HCL INJECTION 0.25 MG/5ML
0.2500 mg | Freq: Once | INTRAVENOUS | Status: AC
  Administered 2023-09-09: 0.25 mg via INTRAVENOUS
  Filled 2023-09-09: qty 5

## 2023-09-09 NOTE — Patient Instructions (Signed)
 [facility] - A DEPT OF [facility]  Discharge Instructions: Thank you for choosing [facility] to provide your oncology and hematology care.   If you have a lab appointment with the Cancer Center, please go directly to the Cancer Center and check in at the registration area.   Wear comfortable clothing and clothing appropriate for easy access to any Portacath or PICC line.   We strive to give you quality time with your provider. You may need to reschedule your appointment if you arrive late (15 or more minutes).  Arriving late affects you and other patients whose appointments are after yours.  Also, if you miss three or more appointments without notifying the office, you may be dismissed from the clinic at the provider's discretion.      For prescription refill requests, have your pharmacy contact our office and allow 72 hours for refills to be completed.    Today you received the following chemotherapy and/or immunotherapy agents: Docetaxel, Cytoxan.       To help prevent nausea and vomiting after your treatment, we encourage you to take your nausea medication as directed.  BELOW ARE SYMPTOMS THAT SHOULD BE REPORTED IMMEDIATELY: *FEVER GREATER THAN 100.4 F (38 C) OR HIGHER *CHILLS OR SWEATING *NAUSEA AND VOMITING THAT IS NOT CONTROLLED WITH YOUR NAUSEA MEDICATION *UNUSUAL SHORTNESS OF BREATH *UNUSUAL BRUISING OR BLEEDING *URINARY PROBLEMS (pain or burning when urinating, or frequent urination) *BOWEL PROBLEMS (unusual diarrhea, constipation, pain near the anus) TENDERNESS IN MOUTH AND THROAT WITH OR WITHOUT PRESENCE OF ULCERS (sore throat, sores in mouth, or a toothache) UNUSUAL RASH, SWELLING OR PAIN  UNUSUAL VAGINAL DISCHARGE OR ITCHING   Items with * indicate a potential emergency and should be followed up as soon as possible or go to the Emergency Department if any problems should occur.  Please show the CHEMOTHERAPY ALERT CARD or  IMMUNOTHERAPY ALERT CARD at check-in to the Emergency Department and triage nurse.  Should you have questions after your visit or need to cancel or reschedule your appointment, please contact [facility] - A DEPT OF [facility]  Dept: [phone]  and follow the prompts.  Office hours are 8:00 a.m. to 4:30 p.m. Monday - Friday. Please note that voicemails left after 4:00 p.m. may not be returned until the following business day.  We are closed weekends and major holidays. You have access to a nurse at all times for urgent questions. Please call the main number to the clinic Dept: [phone] and follow the prompts.   For any non-urgent questions, you may also contact your provider using MyChart. We now offer e-Visits for anyone 72 and older to request care online for non-urgent symptoms. For details visit [email or web address].   Also download the MyChart app! Go to the app store, search "MyChart", open the app, select [facility], and log in with your MyChart username and password.  Docetaxel Injection What is this medication? DOCETAXEL (doe se TAX el) treats some types of cancer. It works by slowing down the growth of cancer cells. This medicine may be used for other purposes; ask your health care provider or pharmacist if you have questions. COMMON BRAND NAME(S): BEIZRAY, Docefrez, Docivyx, Taxotere What should I tell my care team before I take this medication? They need to know if you have any of these conditions: Kidney disease Liver disease Low white blood cell levels Tingling of the fingers or toes  or other nerve disorder An unusual or allergic reaction to docetaxel, polysorbate 80, other medications, foods, dyes, or preservatives Pregnant or trying to get pregnant Breast-feeding How should I use this medication? This medication is injected into a vein. It is given by your care team in a hospital or clinic setting. Talk to your care team about the  use of this medication in children. Special care may be needed. Overdosage: If you think you have taken too much of this medicine contact a poison control center or emergency room at once. NOTE: This medicine is only for you. Do not share this medicine with others. What if I miss a dose? Keep appointments for follow-up doses. It is important not to miss your dose. Call your care team if you are unable to keep an appointment. What may interact with this medication? Do not take this medication with any of the following: Live virus vaccines This medication may also interact with the following: Certain antibiotics, such as clarithromycin, telithromycin Certain antivirals for HIV or hepatitis Certain medications for fungal infections, such as itraconazole, ketoconazole, voriconazole Grapefruit juice Nefazodone Supplements, such as St. John's wort This list may not describe all possible interactions. Give your health care provider a list of all the medicines, herbs, non-prescription drugs, or dietary supplements you use. Also tell them if you smoke, drink alcohol, or use illegal drugs. Some items may interact with your medicine. What should I watch for while using this medication? This medication may make you feel generally unwell. This is not uncommon as chemotherapy can affect healthy cells as well as cancer cells. Report any side effects. Continue your course of treatment even though you feel ill unless your care team tells you to stop. You may need blood work done while you are taking this medication. This medication can cause serious side effects and infusion reactions. To reduce the risk, your care team may give you other medications to take before receiving this one. Be sure to follow the directions from your care team. This medication may increase your risk of getting an infection. Call your care team for advice if you get a fever, chills, sore throat, or other symptoms of a cold or flu. Do not  treat yourself. Try to avoid being around people who are sick. Avoid taking medications that contain aspirin, acetaminophen, ibuprofen, naproxen, or ketoprofen unless instructed by your care team. These medications may hide a fever. Be careful brushing or flossing your teeth or using a toothpick because you may get an infection or bleed more easily. If you have any dental work done, tell your dentist you are receiving this medication. Some products may contain alcohol. Ask your care team if this medication contains alcohol. Be sure to tell all care teams you are taking this medicine. Certain medications, like metronidazole and disulfiram, can cause an unpleasant reaction when taken with alcohol. The reaction includes flushing, headache, nausea, vomiting, sweating, and increased thirst. The reaction can last from 30 minutes to several hours. This medication may affect your coordination, reaction time, or judgement. Do not drive or operate machinery until you know how this medication affects you. Sit up or stand slowly to reduce the risk of dizzy or fainting spells. Drinking alcohol with this medication can increase the risk of these side effects. Talk to your care team about your risk of cancer. You may be more at risk for certain types of cancer if you take this medication. Talk to your care team if you wish to become  pregnant or think you might be pregnant. This medication can cause serious birth defects if taken during pregnancy or if you get pregnant within 2 months after stopping therapy. A negative pregnancy test is required before starting this medication. A reliable form of contraception is recommended while taking this medication and for 2 months after stopping it. Talk to your care team about reliable forms of contraception. Do not breast-feed while taking this medication and for 1 week after stopping therapy. Use a condom during sex and for 4 months after stopping therapy. Tell your care team right  away if you think your partner might be pregnant. This medication can cause serious birth defects. This medication may cause infertility. Talk to your care team if you are concerned about your fertility. What side effects may I notice from receiving this medication? Side effects that you should report to your care team as soon as possible: Allergic reactions--skin rash, itching, hives, swelling of the face, lips, tongue, or throat Change in vision such as blurry vision, seeing halos around lights, vision loss Infection--fever, chills, cough, or sore throat Infusion reactions--chest pain, shortness of breath or trouble breathing, feeling faint or lightheaded Low red blood cell level--unusual weakness or fatigue, dizziness, headache, trouble breathing Pain, tingling, or numbness in the hands or feet Painful swelling, warmth, or redness of the skin, blisters or sores at the infusion site Redness, blistering, peeling, or loosening of the skin, including inside the mouth Sudden or severe stomach pain, bloody diarrhea, fever, nausea, vomiting Swelling of the ankles, hands, or feet Tumor lysis syndrome (TLS)--nausea, vomiting, diarrhea, decrease in the amount of urine, dark urine, unusual weakness or fatigue, confusion, muscle pain or cramps, fast or irregular heartbeat, joint pain Unusual bruising or bleeding Side effects that usually do not require medical attention (report to your care team if they continue or are bothersome): Change in nail shape, thickness, or color Change in taste Hair loss Increased tears This list may not describe all possible side effects. Call your doctor for medical advice about side effects. You may report side effects to FDA at 1-800-FDA-1088. Where should I keep my medication? This medication is given in a hospital or clinic. It will not be stored at home. NOTE: This sheet is a summary. It may not cover all possible information. If you have questions about this  medicine, talk to your doctor, pharmacist, or health care provider.  2024 Elsevier/Gold Standard (2021-07-11 00:00:00) Cyclophosphamide Injection What is this medication? CYCLOPHOSPHAMIDE (sye kloe FOSS fa mide) treats some types of cancer. It works by slowing down the growth of cancer cells. This medicine may be used for other purposes; ask your health care provider or pharmacist if you have questions. COMMON BRAND NAME(S): Cyclophosphamide, Cytoxan, Neosar What should I tell my care team before I take this medication? They need to know if you have any of these conditions: Heart disease Irregular heartbeat or rhythm Infection Kidney problems Liver disease Low blood cell levels (white cells, platelets, or red blood cells) Lung disease Previous radiation Trouble passing urine An unusual or allergic reaction to cyclophosphamide, other medications, foods, dyes, or preservatives Pregnant or trying to get pregnant Breast-feeding How should I use this medication? This medication is injected into a vein. It is given by your care team in a hospital or clinic setting. Talk to your care team about the use of this medication in children. Special care may be needed. Overdosage: If you think you have taken too much of this medicine contact a  poison control center or emergency room at once. NOTE: This medicine is only for you. Do not share this medicine with others. What if I miss a dose? Keep appointments for follow-up doses. It is important not to miss your dose. Call your care team if you are unable to keep an appointment. What may interact with this medication? Amphotericin B Amiodarone Azathioprine Certain antivirals for HIV or hepatitis Certain medications for blood pressure, such as enalapril, lisinopril, quinapril Cyclosporine Diuretics Etanercept Indomethacin Medications that relax muscles Metronidazole Natalizumab Tamoxifen Warfarin This list may not describe all possible  interactions. Give your health care provider a list of all the medicines, herbs, non-prescription drugs, or dietary supplements you use. Also tell them if you smoke, drink alcohol, or use illegal drugs. Some items may interact with your medicine. What should I watch for while using this medication? This medication may make you feel generally unwell. This is not uncommon as chemotherapy can affect healthy cells as well as cancer cells. Report any side effects. Continue your course of treatment even though you feel ill unless your care team tells you to stop. You may need blood work while you are taking this medication. This medication may increase your risk of getting an infection. Call your care team for advice if you get a fever, chills, sore throat, or other symptoms of a cold or flu. Do not treat yourself. Try to avoid being around people who are sick. Avoid taking medications that contain aspirin, acetaminophen, ibuprofen, naproxen, or ketoprofen unless instructed by your care team. These medications may hide a fever. Be careful brushing or flossing your teeth or using a toothpick because you may get an infection or bleed more easily. If you have any dental work done, tell your dentist you are receiving this medication. Drink water or other fluids as directed. Urinate often, even at night. Some products may contain alcohol. Ask your care team if this medication contains alcohol. Be sure to tell all care teams you are taking this medicine. Certain medicines, like metronidazole and disulfiram, can cause an unpleasant reaction when taken with alcohol. The reaction includes flushing, headache, nausea, vomiting, sweating, and increased thirst. The reaction can last from 30 minutes to several hours. Talk to your care team if you wish to become pregnant or think you might be pregnant. This medication can cause serious birth defects if taken during pregnancy and for 1 year after the last dose. A negative  pregnancy test is required before starting this medication. A reliable form of contraception is recommended while taking this medication and for 1 year after the last dose. Talk to your care team about reliable forms of contraception. Do not father a child while taking this medication and for 4 months after the last dose. Use a condom during this time period. Do not breast-feed while taking this medication or for 1 week after the last dose. This medication may cause infertility. Talk to your care team if you are concerned about your fertility. Talk to your care team about your risk of cancer. You may be more at risk for certain types of cancer if you take this medication. What side effects may I notice from receiving this medication? Side effects that you should report to your care team as soon as possible: Allergic reactions--skin rash, itching, hives, swelling of the face, lips, tongue, or throat Dry cough, shortness of breath or trouble breathing Heart failure--shortness of breath, swelling of the ankles, feet, or hands, sudden weight gain, unusual weakness  or fatigue Heart muscle inflammation--unusual weakness or fatigue, shortness of breath, chest pain, fast or irregular heartbeat, dizziness, swelling of the ankles, feet, or hands Heart rhythm changes--fast or irregular heartbeat, dizziness, feeling faint or lightheaded, chest pain, trouble breathing Infection--fever, chills, cough, sore throat, wounds that don't heal, pain or trouble when passing urine, general feeling of discomfort or being unwell Kidney injury--decrease in the amount of urine, swelling of the ankles, hands, or feet Liver injury--right upper belly pain, loss of appetite, nausea, light-colored stool, dark yellow or brown urine, yellowing skin or eyes, unusual weakness or fatigue Low red blood cell level--unusual weakness or fatigue, dizziness, headache, trouble breathing Low sodium level--muscle weakness, fatigue, dizziness,  headache, confusion Red or dark brown urine Unusual bruising or bleeding Side effects that usually do not require medical attention (report to your care team if they continue or are bothersome): Hair loss Irregular menstrual cycles or spotting Loss of appetite Nausea Pain, redness, or swelling with sores inside the mouth or throat Vomiting This list may not describe all possible side effects. Call your doctor for medical advice about side effects. You may report side effects to FDA at 1-800-FDA-1088. Where should I keep my medication? This medication is given in a hospital or clinic. It will not be stored at home. NOTE: This sheet is a summary. It may not cover all possible information. If you have questions about this medicine, talk to your doctor, pharmacist, or health care provider.  2024 Elsevier/Gold Standard (2021-09-20 00:00:00)

## 2023-09-11 ENCOUNTER — Inpatient Hospital Stay: Payer: BC Managed Care – PPO

## 2023-09-11 VITALS — BP 105/63 | HR 73 | Temp 98.5°F | Resp 16

## 2023-09-11 DIAGNOSIS — Z5111 Encounter for antineoplastic chemotherapy: Secondary | ICD-10-CM | POA: Diagnosis not present

## 2023-09-11 DIAGNOSIS — Z170 Estrogen receptor positive status [ER+]: Secondary | ICD-10-CM

## 2023-09-11 MED ORDER — PEGFILGRASTIM-JMDB 6 MG/0.6ML ~~LOC~~ SOSY
6.0000 mg | PREFILLED_SYRINGE | Freq: Once | SUBCUTANEOUS | Status: AC
  Administered 2023-09-11: 6 mg via SUBCUTANEOUS
  Filled 2023-09-11: qty 0.6

## 2023-09-11 NOTE — Progress Notes (Signed)
 Patient here for Fuphila injection.  C/O fatigue, dizziness, drank 48 oz. of water yesterday and 32 oz. on today.  VS charted.  Secure Chat Heather/PA, Shelbra/RN and Kiandra/CMA.  Pattie Borders wanted to know if patient ate today? Advised for patient to increase her water intake.  Also wanted to know if patient had a driver?  Patient states she ate this morning and she has a driver with her.  Pattie Borders states patient is at her baseline with her VS.  Patient informed per Pattie Borders to increase her water intake.  States the fatigue is normal, but not sure about the dizziness.  Patient needs to increase her water intake to 64 oz. of water daily.  Patient was informed of all the above.

## 2023-09-13 ENCOUNTER — Encounter: Payer: Self-pay | Admitting: Hematology

## 2023-09-13 ENCOUNTER — Encounter: Payer: Self-pay | Admitting: Nurse Practitioner

## 2023-09-25 ENCOUNTER — Ambulatory Visit: Payer: BC Managed Care – PPO

## 2023-09-29 ENCOUNTER — Encounter: Payer: Self-pay | Admitting: *Deleted

## 2023-09-29 DIAGNOSIS — C50412 Malignant neoplasm of upper-outer quadrant of left female breast: Secondary | ICD-10-CM

## 2023-09-29 MED FILL — Fosaprepitant Dimeglumine For IV Infusion 150 MG (Base Eq): INTRAVENOUS | Qty: 5 | Status: AC

## 2023-09-29 NOTE — Progress Notes (Unsigned)
 [facility]     Telephone:(336) [phone] [phone]    Patient Care Team: Narda Bacon, MD as PCP - General (Internal Medicine) Auther Bo, RN as Oncology Nurse Navigator Alane Hsu, RN as Oncology Nurse Navigator Sonja Pilgrim, MD as Consulting Physician (Hematology)   CHIEF COMPLAINT: Follow up left breast cancer   Oncology History  Malignant neoplasm of upper-outer quadrant of left breast in female, estrogen receptor positive (HCC)  05/24/2022 Surgery   Surgical pathology: Left lumpectomy, tumor size 2.3 x 2.0 x 1.8 cm, invasive ductal carcinoma with mucinous.  Grade 2,  (+) DCIS G2. (+).  Lymphovascular invasion (+), perineural invasion(-).  Surgical margins were negative.  No lymph nodes seen in the surgical sample.  ER 75% positive, moderate to strong staining, PR 20% positive, moderate staining, HER2 2+, HER2 negative by FISH.  Ki-67 40%   05/13/2023 Initial Biopsy   Biopsy of left breast mass: invasive ductal carcinoma,   IHC: ER +90% strong staining, PR 30% positive, moderate staining, HER2 2+, Ki-67 25%, e-cadherin (+)  Left axillary node biopsy: negative for malignant cells    05/21/2023 Imaging   Mammogram showed a irregular 2.2 x 2.0 cm mass in the upper outer quadrant of left breast, 3.3 cm from nipple, with irregular border and calcification, BI-RAD 4b, biopsy recommended.  In the right breast, there is a 1 cm nodule, likely benign, and scattered calcification, likely benign.    05/24/2023 Cancer Staging   Staging form: Breast, AJCC 8th Edition - Pathologic stage from 05/24/2023: Stage IA (pT2, pN0, cM0, G2, ER+, PR+, HER2-) - Signed by Sonja Hazelton, MD on 06/16/2023 Histologic grading system: 3 grade system Residual tumor (R): R0 - None   06/16/2023 Initial Diagnosis   Malignant neoplasm of upper-outer quadrant of left breast in female, estrogen receptor positive (HCC)   07/29/2023 -  Chemotherapy   Patient is on Treatment Plan : BREAST TC q21d         CURRENT THERAPY: Adjuvant chemotherapy TC q21 days x4 cycles, starting 07/28/23  INTERVAL HISTORY Ms. Patil returns for follow up and treatment as scheduled. Last seen 09/09/23 with cycle 3 TC. Tolerating chemo, glad this is her last cycle. Alternates between constipation and diarrhea, manages with prunes/fruit and diet mostly. Denies n/v. Has fatigue and joint pain but still mobile/functional and managing well at home. She has developed left breast pain at the nipple, similar to initial diagnosis. Denies new lump, mass, nipple discharge, skin change, or other specific complaints.   ROS  All other systems reviewed and negative  Past Medical History:  Diagnosis Date   Cancer (HCC) 07/10/2023   Breast     Past Surgical History:  Procedure Laterality Date   APPENDECTOMY     BREAST LUMPECTOMY Left 05/24/2023   PORTACATH PLACEMENT Right 07/17/2023   Procedure: INSERTION PORT-A-CATH WITH ULTRASOUND GUIDANCE;  Surgeon: Enid Harry, MD;  Location: [facility];  Service: General;  Laterality: Right;     Outpatient Encounter Medications as of 09/30/2023  Medication Sig   dexamethasone  (DECADRON ) 4 MG tablet Take 2 tabs by mouth 2 times daily starting day before chemo. Then take 2 tabs daily for 2 days starting day after chemo. Take with food.   lidocaine -prilocaine  (EMLA ) cream Apply to affected area once   LORazepam  (ATIVAN ) 1 MG tablet Take 1 tablet (1 mg total) by mouth every 8 (eight) hours as needed (severe nausea).   ondansetron  (ZOFRAN ) 8  MG tablet Take 1 tablet (8 mg total) by mouth every 8 (eight) hours as needed for nausea or vomiting. Start on the third day after chemotherapy.   prochlorperazine  (COMPAZINE ) 10 MG tablet Take 1 tablet (10 mg total) by mouth every 6 (six) hours as needed for nausea or vomiting.   Facility-Administered Encounter Medications as of 09/30/2023  Medication   [DISCONTINUED] sodium chloride  flush (NS) 0.9 % injection 10 mL      Today's Vitals   09/30/23 1002  BP: 120/82  Pulse: 77  Resp: 18  Temp: 97.7 F (36.5 C)  TempSrc: Temporal  SpO2: 98%  Weight: 112 lb 9.6 oz (51.1 kg)   Body mass index is 20.59 kg/m.   ECOG PERFORMANCE STATUS: 1 - Symptomatic but completely ambulatory  PHYSICAL EXAM GENERAL:alert, no distress and comfortable SKIN: no rash  EYES: sclera clear NECK: without mass LYMPH:  no palpable cervical or supraclavicular lymphadenopathy  LUNGS:  normal breathing effort HEART: no lower extremity edema NEURO: alert & oriented x 3 with fluent speech, no focal motor/sensory deficits Breast exam: L breast s/p lumpectomy, incisions completely healed. Density in the central breast likely scar tissue vs seroma vs hematoma as there is some skin discoloration ?bruise vs dye PAC without erythema    CBC    Latest Ref Rng & Units 09/30/2023    9:27 AM 09/09/2023    8:29 AM 08/19/2023    8:48 AM  CBC  WBC 4.0 - 10.5 K/uL 11.5  8.6  3.3   Hemoglobin 12.0 - 15.0 g/dL 52.8  41.3  24.4   Hematocrit 36.0 - 46.0 % 33.6  34.2  32.9   Platelets 150 - 400 K/uL 166  172  137       CMP     Latest Ref Rng & Units 09/30/2023    9:27 AM 09/09/2023    8:29 AM 08/19/2023    8:48 AM  CMP  Glucose 70 - 99 mg/dL 010  272  79   BUN 6 - 20 mg/dL 13  12  11    Creatinine 0.44 - 1.00 mg/dL 5.36  6.44  0.34   Sodium 135 - 145 mmol/L 141  140  142   Potassium 3.5 - 5.1 mmol/L 3.8  3.8  3.8   Chloride 98 - 111 mmol/L 106  107  109   CO2 22 - 32 mmol/L 27  26  28    Calcium 8.9 - 10.3 mg/dL 9.3  9.4  8.8   Total Protein 6.5 - 8.1 g/dL 6.5  6.6  6.0   Total Bilirubin 0.0 - 1.2 mg/dL 0.4  0.4  0.4   Alkaline Phos 38 - 126 U/L 76  77  58   AST 15 - 41 U/L 15  18  16    ALT 0 - 44 U/L 19  31  25        ASSESSMENT & PLAN: 43 yo female   Malignant neoplasm of upper-outer quadrant of left breast in female, estrogen receptor positive, pT2N0M0, stage IA, ER+/PR+/HER2- -S/p lumpectomy on 05/24/23. Tumor size 2.4 cm and  DCIS. Sentinel lymph node biopsy attempted but no sentinel lymph nodes found. Family history of breast cancer in a cousin.  -Oncotype RS 15, with 21% predicted risk of distant recurrence with tamoxifen in the next 9 years.  Adjuvant chemotherapy is recommended -she started adjuvant chemotherapy TC every 3 weeks for 4 cycles on 07/28/2023, tolerating well overall with fatigue, joint pain, and constipation/diarrhea.  Side effects are adequately  managed with supportive care at home, she is able to recover and function well with adequate performance status - Exam shows likely scar tissue versus seroma - Labs reviewed, adequate to proceed with fourth and final cycle today, no dose modifications. - Rad onc consult and injection 5/16 - Will see her back in 8 weeks for port flush and to discuss further adjuvant therapy. OK to keep her port in for now. She knows to call sooner if needed    PLAN: -Labs reviewed -Proceed with final C4 adjuvant TC today, no dose adjustments -Rad onc consult/Sim 5/26 then injection -Reviewed treatment plan, symptom management for fatigue/joint pain etc -Lab, flush, f/up in 8 weeks to discuss further adjuvant therapy     All questions were answered. The patient knows to call the clinic with any problems, questions or concerns. No barriers to learning were detected. I spent 20 minutes counseling the patient face to face. The total time spent in the appointment was 30 minutes and more than 50% was on counseling, review of test results, and coordination of care.   Rakel Junio K Tony Friscia, NP 09/30/2023

## 2023-09-30 ENCOUNTER — Inpatient Hospital Stay: Payer: BC Managed Care – PPO | Attending: Hematology

## 2023-09-30 ENCOUNTER — Other Ambulatory Visit: Payer: BC Managed Care – PPO

## 2023-09-30 ENCOUNTER — Inpatient Hospital Stay

## 2023-09-30 ENCOUNTER — Encounter: Payer: Self-pay | Admitting: *Deleted

## 2023-09-30 ENCOUNTER — Encounter: Payer: Self-pay | Admitting: Nurse Practitioner

## 2023-09-30 ENCOUNTER — Inpatient Hospital Stay: Admitting: Nurse Practitioner

## 2023-09-30 ENCOUNTER — Ambulatory Visit: Payer: BC Managed Care – PPO | Admitting: Nurse Practitioner

## 2023-09-30 VITALS — BP 120/82 | HR 77 | Temp 97.7°F | Resp 18 | Wt 112.6 lb

## 2023-09-30 DIAGNOSIS — Z5189 Encounter for other specified aftercare: Secondary | ICD-10-CM | POA: Diagnosis not present

## 2023-09-30 DIAGNOSIS — D649 Anemia, unspecified: Secondary | ICD-10-CM | POA: Insufficient documentation

## 2023-09-30 DIAGNOSIS — C50412 Malignant neoplasm of upper-outer quadrant of left female breast: Secondary | ICD-10-CM | POA: Insufficient documentation

## 2023-09-30 DIAGNOSIS — Z170 Estrogen receptor positive status [ER+]: Secondary | ICD-10-CM | POA: Insufficient documentation

## 2023-09-30 DIAGNOSIS — D500 Iron deficiency anemia secondary to blood loss (chronic): Secondary | ICD-10-CM

## 2023-09-30 DIAGNOSIS — Z5111 Encounter for antineoplastic chemotherapy: Secondary | ICD-10-CM | POA: Insufficient documentation

## 2023-09-30 DIAGNOSIS — Z1732 Human epidermal growth factor receptor 2 negative status: Secondary | ICD-10-CM | POA: Insufficient documentation

## 2023-09-30 DIAGNOSIS — Z95828 Presence of other vascular implants and grafts: Secondary | ICD-10-CM

## 2023-09-30 LAB — CBC WITH DIFFERENTIAL (CANCER CENTER ONLY)
Abs Immature Granulocytes: 0.12 10*3/uL — ABNORMAL HIGH (ref 0.00–0.07)
Basophils Absolute: 0 10*3/uL (ref 0.0–0.1)
Basophils Relative: 0 %
Eosinophils Absolute: 0 10*3/uL (ref 0.0–0.5)
Eosinophils Relative: 0 %
HCT: 33.6 % — ABNORMAL LOW (ref 36.0–46.0)
Hemoglobin: 10.3 g/dL — ABNORMAL LOW (ref 12.0–15.0)
Immature Granulocytes: 1 %
Lymphocytes Relative: 5 %
Lymphs Abs: 0.6 10*3/uL — ABNORMAL LOW (ref 0.7–4.0)
MCH: 21.1 pg — ABNORMAL LOW (ref 26.0–34.0)
MCHC: 30.7 g/dL (ref 30.0–36.0)
MCV: 68.7 fL — ABNORMAL LOW (ref 80.0–100.0)
Monocytes Absolute: 0.5 10*3/uL (ref 0.1–1.0)
Monocytes Relative: 4 %
Neutro Abs: 10.3 10*3/uL — ABNORMAL HIGH (ref 1.7–7.7)
Neutrophils Relative %: 90 %
Platelet Count: 166 10*3/uL (ref 150–400)
RBC: 4.89 MIL/uL (ref 3.87–5.11)
RDW: 23.4 % — ABNORMAL HIGH (ref 11.5–15.5)
WBC Count: 11.5 10*3/uL — ABNORMAL HIGH (ref 4.0–10.5)
nRBC: 0 % (ref 0.0–0.2)

## 2023-09-30 LAB — CMP (CANCER CENTER ONLY)
ALT: 19 U/L (ref 0–44)
AST: 15 U/L (ref 15–41)
Albumin: 4.3 g/dL (ref 3.5–5.0)
Alkaline Phosphatase: 76 U/L (ref 38–126)
Anion gap: 8 (ref 5–15)
BUN: 13 mg/dL (ref 6–20)
CO2: 27 mmol/L (ref 22–32)
Calcium: 9.3 mg/dL (ref 8.9–10.3)
Chloride: 106 mmol/L (ref 98–111)
Creatinine: 0.5 mg/dL (ref 0.44–1.00)
GFR, Estimated: >60 mL/min (ref >60)
Glucose, Bld: 178 mg/dL — ABNORMAL HIGH (ref 70–99)
Potassium: 3.8 mmol/L (ref 3.5–5.1)
Sodium: 141 mmol/L (ref 135–145)
Total Bilirubin: 0.4 mg/dL (ref 0.0–1.2)
Total Protein: 6.5 g/dL (ref 6.5–8.1)

## 2023-09-30 LAB — PREGNANCY, URINE: Preg Test, Ur: NEGATIVE

## 2023-09-30 LAB — FERRITIN: Ferritin: 184 ng/mL (ref 11–307)

## 2023-09-30 MED ORDER — HEPARIN SOD (PORK) LOCK FLUSH 100 UNIT/ML IV SOLN
500.0000 [IU] | Freq: Once | INTRAVENOUS | Status: AC | PRN
  Administered 2023-09-30: 500 [IU]

## 2023-09-30 MED ORDER — SODIUM CHLORIDE 0.9 % IV SOLN
150.0000 mg | Freq: Once | INTRAVENOUS | Status: AC
Start: 2023-09-30 — End: 2023-09-30
  Administered 2023-09-30: 150 mg via INTRAVENOUS
  Filled 2023-09-30: qty 150

## 2023-09-30 MED ORDER — PALONOSETRON HCL INJECTION 0.25 MG/5ML
0.2500 mg | Freq: Once | INTRAVENOUS | Status: AC
  Administered 2023-09-30: 0.25 mg via INTRAVENOUS
  Filled 2023-09-30: qty 5

## 2023-09-30 MED ORDER — DEXAMETHASONE SODIUM PHOSPHATE 10 MG/ML IJ SOLN
10.0000 mg | Freq: Once | INTRAMUSCULAR | Status: AC
  Administered 2023-09-30: 10 mg via INTRAVENOUS
  Filled 2023-09-30: qty 1

## 2023-09-30 MED ORDER — SODIUM CHLORIDE 0.9% FLUSH
10.0000 mL | INTRAVENOUS | Status: DC | PRN
  Administered 2023-09-30: 10 mL

## 2023-09-30 MED ORDER — SODIUM CHLORIDE 0.9 % IV SOLN
600.0000 mg/m2 | Freq: Once | INTRAVENOUS | Status: AC
  Administered 2023-09-30: 900 mg via INTRAVENOUS
  Filled 2023-09-30: qty 45

## 2023-09-30 MED ORDER — SODIUM CHLORIDE 0.9 % IV SOLN: INTRAVENOUS | Status: DC

## 2023-09-30 MED ORDER — SODIUM CHLORIDE 0.9 % IV SOLN
75.0000 mg/m2 | Freq: Once | INTRAVENOUS | Status: AC
  Administered 2023-09-30: 112 mg via INTRAVENOUS
  Filled 2023-09-30: qty 11.2

## 2023-09-30 NOTE — Patient Instructions (Signed)
 [facility] - A DEPT OF [facility]  Discharge Instructions: Thank you for choosing [facility] to provide your oncology and hematology care.   If you have a lab appointment with the Cancer Center, please go directly to the Cancer Center and check in at the registration area.   Wear comfortable clothing and clothing appropriate for easy access to any Portacath or PICC line.   We strive to give you quality time with your provider. You may need to reschedule your appointment if you arrive late (15 or more minutes).  Arriving late affects you and other patients whose appointments are after yours.  Also, if you miss three or more appointments without notifying the office, you may be dismissed from the clinic at the provider's discretion.      For prescription refill requests, have your pharmacy contact our office and allow 72 hours for refills to be completed.    Today you received the following chemotherapy and/or immunotherapy agents: Docetaxel, Cyclophosphamide      To help prevent nausea and vomiting after your treatment, we encourage you to take your nausea medication as directed.  BELOW ARE SYMPTOMS THAT SHOULD BE REPORTED IMMEDIATELY: *FEVER GREATER THAN 100.4 F (38 C) OR HIGHER *CHILLS OR SWEATING *NAUSEA AND VOMITING THAT IS NOT CONTROLLED WITH YOUR NAUSEA MEDICATION *UNUSUAL SHORTNESS OF BREATH *UNUSUAL BRUISING OR BLEEDING *URINARY PROBLEMS (pain or burning when urinating, or frequent urination) *BOWEL PROBLEMS (unusual diarrhea, constipation, pain near the anus) TENDERNESS IN MOUTH AND THROAT WITH OR WITHOUT PRESENCE OF ULCERS (sore throat, sores in mouth, or a toothache) UNUSUAL RASH, SWELLING OR PAIN  UNUSUAL VAGINAL DISCHARGE OR ITCHING   Items with * indicate a potential emergency and should be followed up as soon as possible or go to the Emergency Department if any problems should occur.  Please show the CHEMOTHERAPY ALERT CARD  or IMMUNOTHERAPY ALERT CARD at check-in to the Emergency Department and triage nurse.  Should you have questions after your visit or need to cancel or reschedule your appointment, please contact [facility] - A DEPT OF [facility]  Dept: [phone]  and follow the prompts.  Office hours are 8:00 a.m. to 4:30 p.m. Monday - Friday. Please note that voicemails left after 4:00 p.m. may not be returned until the following business day.  We are closed weekends and major holidays. You have access to a nurse at all times for urgent questions. Please call the main number to the clinic Dept: [phone] and follow the prompts.   For any non-urgent questions, you may also contact your provider using MyChart. We now offer e-Visits for anyone 15 and older to request care online for non-urgent symptoms. For details visit [email or web address].   Also download the MyChart app! Go to the app store, search "MyChart", open the app, select [facility], and log in with your MyChart username and password.

## 2023-10-01 NOTE — Progress Notes (Signed)
 New Breast Cancer Diagnosis: Left Breast UOQ    Histology per Pathology Report: grade 2, Invasive Ductal Carcinoma  Receptor Status: ER(positive), PR (positive), Her2-neu (negative), Ki-(40%)  Surgeon and surgical plan, if any:   -Lumpectomy 05/24/2023   Medical oncologist, treatment if any:   Lacie Burton NP / Dr. Maryalice Smaller 09/30/2023 -Adjuvant chemotherapy- completed 09/30/2023 -Radiation simulation 10/02/2023 -Will see her back in 8 weeks for port flush and to discuss further adjuvant therapy.   Family History of Breast/Ovarian/Prostate Cancer: Maternal Cousin had breast cancer  Lymphedema issues, if any: No      Pain issues, if any: She reports tingling and mild pain at her incision site, scar tissue in this area.     SAFETY ISSUES: Prior radiation? No Pacemaker/ICD? No Possible current pregnancy? Irregular Cycles,  Is the patient on methotrexate? No  Current Complaints / other details:   Port Insertion 07/14/2023

## 2023-10-02 ENCOUNTER — Ambulatory Visit
Admission: RE | Admit: 2023-10-02 | Discharge: 2023-10-02 | Disposition: A | Discharge status: Home / Self Care | Source: Ambulatory Visit | Attending: Radiation Oncology | Admitting: Radiation Oncology

## 2023-10-02 ENCOUNTER — Inpatient Hospital Stay: Payer: BC Managed Care – PPO

## 2023-10-02 ENCOUNTER — Encounter: Payer: Self-pay | Admitting: Radiation Oncology

## 2023-10-02 VITALS — BP 94/56 | HR 97 | Temp 97.5°F | Resp 18 | Ht 62.0 in | Wt 112.8 lb

## 2023-10-02 DIAGNOSIS — Z5111 Encounter for antineoplastic chemotherapy: Secondary | ICD-10-CM | POA: Diagnosis not present

## 2023-10-02 DIAGNOSIS — C50412 Malignant neoplasm of upper-outer quadrant of left female breast: Secondary | ICD-10-CM

## 2023-10-02 DIAGNOSIS — Z7952 Long term (current) use of systemic steroids: Secondary | ICD-10-CM | POA: Diagnosis not present

## 2023-10-02 DIAGNOSIS — Z803 Family history of malignant neoplasm of breast: Secondary | ICD-10-CM | POA: Insufficient documentation

## 2023-10-02 DIAGNOSIS — Z170 Estrogen receptor positive status [ER+]: Secondary | ICD-10-CM | POA: Diagnosis not present

## 2023-10-02 MED ORDER — PEGFILGRASTIM-JMDB 6 MG/0.6ML ~~LOC~~ SOSY
6.0000 mg | PREFILLED_SYRINGE | Freq: Once | SUBCUTANEOUS | Status: AC
  Administered 2023-10-02: 6 mg via SUBCUTANEOUS
  Filled 2023-10-02: qty 0.6

## 2023-10-02 NOTE — Progress Notes (Signed)
 Radiation Oncology         [phone] ________________________________  Name: Alexis [name]        MRN: [id number]  Date of Service: 10/02/2023 DOB: [birth year]  CC:Sun, Gorge Laud, MD  Sonja [place], MD     REFERRING PHYSICIAN: Sonja [place], MD   DIAGNOSIS: The encounter diagnosis was Malignant neoplasm of upper-outer quadrant of left breast in female, estrogen receptor positive (HCC).    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 female seen at the request of Dr. Maryalice Smaller for a new diagnosis of left breast cancer.   Patient was originally seen by Dr. Afton Horse for left nipple discharge.  Follow-up MRI was recommended, but it does not appear this happened.  Patient palpated a left breast mass while on vacation in [place]. Patient underwent biopsy of the palpable left breast mass on 05/13/2023.  Pathology demonstrated intermediate grade invasive ductal carcinoma.  Prognostic indicators significant for: Estrogen receptor +40% weak to moderate staining; progesterone receptor +40%, with moderate to strong staining intensity; HER2 negative; Ki-67 not noted.  Patient proceeded with a lumpectomy in [place] on 05/24/2023.  Surgical pathology demonstrated intermediate grade invasive ductal carcinoma approximately 2.4 cm in greatest dimension.  Sentinel lymph node biopsy was attempted at that time, but no sentinel lymph nodes were found.  She was seen by medical oncologist, Dr. Maryalice Smaller, for follow-up. Oncotype  testing on the surgical specimen predicted a 21% risk of distant recurrence with tamoxifen in the next 9 years. Patient underwent ultrasound of the axilla on 06/19/2023 which showed no abnormal lymph nodes. Given these results, Dr. Maryalice Smaller recommended adjuvant chemotherapy.  She was started adjuvant chemotherapy of TC q3 weeks on 07/28/2023 and completed her fourth and final cycle on 09/30/2023.   She is seen today to discuss radiation treatment options.   PREVIOUS RADIATION THERAPY: No   PAST MEDICAL HISTORY:  Past Medical History:  Diagnosis Date   Cancer (HCC) 07/10/2023   Breast       PAST SURGICAL HISTORY: Past Surgical History:  Procedure Laterality Date   APPENDECTOMY     BREAST LUMPECTOMY Left 05/24/2023   PORTACATH PLACEMENT Right 07/17/2023   Procedure: INSERTION PORT-A-CATH WITH ULTRASOUND GUIDANCE;  Surgeon: Enid Harry, MD;  Location: [facility];  Service: General;  Laterality: Right;     FAMILY HISTORY:  Family History  Problem Relation Age of Onset   Hypertension Mother    Hypertension Father    Cancer Cousin 84       breast cancer     SOCIAL HISTORY:  reports that she has never smoked. She has never used smokeless tobacco. She reports current alcohol use. She reports that she does not use drugs.   ALLERGIES: Apple   MEDICATIONS:  Current Outpatient Medications  Medication Sig Dispense Refill   dexamethasone  (DECADRON ) 4 MG tablet Take 2 tabs by mouth 2 times daily starting day before chemo. Then take 2 tabs daily for 2 days starting day after chemo. Take with food. 30 tablet 1  lidocaine -prilocaine  (EMLA ) cream Apply to affected area once 30 g 3   loratadine  (CLARITIN ) 10 MG tablet Take 10 mg by mouth daily as needed for allergies.     LORazepam  (ATIVAN ) 1 MG tablet Take 1 tablet (1 mg total) by mouth every 8 (eight) hours as needed (severe nausea). 10 tablet 0   ondansetron  (ZOFRAN ) 8 MG tablet Take 1 tablet (8 mg total) by mouth  every 8 (eight) hours as needed for nausea or vomiting. Start on the third day after chemotherapy. 30 tablet 1   prochlorperazine  (COMPAZINE ) 10 MG tablet Take 1 tablet (10 mg total) by mouth every 6 (six) hours as needed for nausea or vomiting. 30 tablet 1   No current facility-administered medications for this encounter.     REVIEW OF SYSTEMS: On review of systems, the patient reports that she is doing well overall. She notes tenderness at her incision site and tingling in her breast. She is also experiencing residual fatigue from her chemotherapy.     PHYSICAL EXAM:  Wt Readings from Last 3 Encounters:  10/02/23 112 lb 12.8 oz (51.2 kg)  09/30/23 112 lb 9.6 oz (51.1 kg)  09/09/23 113 lb 1.6 oz (51.3 kg)   Temp Readings from Last 3 Encounters:  10/02/23 (!) 97.5 F (36.4 C)  09/30/23 97.7 F (36.5 C) (Temporal)  09/11/23 98.5 F (36.9 C) (Oral)   BP Readings from Last 3 Encounters:  10/02/23 (!) 94/56  09/30/23 120/82  09/11/23 105/63   Pulse Readings from Last 3 Encounters:  10/02/23 97  09/30/23 77  09/11/23 73    In general this is a well appearing female in no acute distress. She's alert and oriented x4 and appropriate throughout the examination. Cardiopulmonary assessment is negative for acute distress and she exhibits normal effort. Bilateral breast exam is deferred.    ECOG = 0  0 - Asymptomatic (Fully active, able to carry on all predisease activities without restriction)  1 - Symptomatic but completely ambulatory (Restricted in physically strenuous activity but ambulatory and able to carry out work of a light or sedentary nature. For example, light housework, office work)  2 - Symptomatic, <50% in bed during the day (Ambulatory and capable of all self care but unable to carry out any work activities. Up and about more than 50% of waking hours)  3 - Symptomatic, >50% in bed, but not bedbound (Capable of only limited self-care, confined to bed or chair 50% or  more of waking hours)  4 - Bedbound (Completely disabled. Cannot carry on any self-care. Totally confined to bed or chair)  5 - Death   Aurea Blossom MM, Creech RH, Tormey DC, et al. [phone]). "Toxicity and response criteria of the [facility]". Am. Hillard Lowes. Oncol. 5 (6): 649-55    LABORATORY DATA:  Lab Results  Component Value Date   WBC 11.5 (H) 09/30/2023   HGB 10.3 (L) 09/30/2023   HCT 33.6 (L) 09/30/2023   MCV 68.7 (L) 09/30/2023   PLT 166 09/30/2023   Lab Results  Component Value Date   NA 141 09/30/2023   K 3.8 09/30/2023   CL 106 09/30/2023   CO2 27 09/30/2023   Lab Results  Component Value Date   ALT 19 09/30/2023   AST 15 09/30/2023   ALKPHOS 76 09/30/2023   BILITOT 0.4 09/30/2023      RADIOGRAPHY: No results found.     IMPRESSION/PLAN: 1. Stage IA (pT2, pN0, M0) intermediate grade invasive ductal carcinoma of the  left breast, ER/PR +, HER-; s/p left lumpectomy and currently on neoadjuvant chemotherapy  Patient continues to heal from the effects of her chemotherapy treatments. Dr. Jeryl Moris recommends adjuvant radiotherapy to the left breast in order to decrease her risk of locoregional disease recurrence   We discussed the risks, benefits, and side effects of radiotherapy. We discussed that radiation would take approximately 4 weeks to complete. We spoke about acute effects including skin irritation and fatigue as well as much less common late effects including injury to internal organs of the chest. We spoke about the latest technology that is used to minimize the risk of late effects for breast cancer patients undergoing radiotherapy. No guarantees of treatment were given. She is enthusiastic about proceeding with treatment.    Patient will be scheduled for CT simulation in approximately 3-4 weeks to allow for adequate healing. We look forward to participating in this patient's care.   In a visit lasting 60 minutes, greater than 50% of the time was  spent face to face discussing the patient's condition, in preparation for the discussion, and coordinating the patient's care.   The above documentation reflects my direct findings during this shared patient visit. Please see the separate note by Dr. Jeryl Moris on this date for the remainder of the patient's plan of care.    Amiel Kalata, [place]    **Disclaimer: This note was dictated with voice recognition software. Similar sounding words can inadvertently be transcribed and this note may contain transcription errors which may not have been corrected upon publication of note.**

## 2023-10-09 ENCOUNTER — Encounter: Payer: Self-pay | Admitting: *Deleted

## 2023-10-15 ENCOUNTER — Other Ambulatory Visit: Payer: Self-pay

## 2023-10-16 ENCOUNTER — Other Ambulatory Visit: Payer: Self-pay

## 2023-10-20 ENCOUNTER — Encounter: Payer: Self-pay | Admitting: *Deleted

## 2023-10-20 DIAGNOSIS — C50412 Malignant neoplasm of upper-outer quadrant of left female breast: Secondary | ICD-10-CM

## 2023-10-22 ENCOUNTER — Ambulatory Visit
Admission: RE | Admit: 2023-10-22 | Discharge: 2023-10-22 | Disposition: A | Discharge status: Home / Self Care | Source: Ambulatory Visit | Attending: Radiation Oncology | Admitting: Radiation Oncology

## 2023-10-22 DIAGNOSIS — Z510 Encounter for antineoplastic radiation therapy: Secondary | ICD-10-CM | POA: Diagnosis present

## 2023-10-22 DIAGNOSIS — Z170 Estrogen receptor positive status [ER+]: Secondary | ICD-10-CM | POA: Insufficient documentation

## 2023-10-22 DIAGNOSIS — C50412 Malignant neoplasm of upper-outer quadrant of left female breast: Secondary | ICD-10-CM | POA: Insufficient documentation

## 2023-10-30 ENCOUNTER — Encounter: Payer: Self-pay | Admitting: Hematology

## 2023-11-04 DIAGNOSIS — Z510 Encounter for antineoplastic radiation therapy: Secondary | ICD-10-CM | POA: Diagnosis not present

## 2023-11-05 ENCOUNTER — Encounter: Payer: Self-pay | Admitting: Hematology

## 2023-11-05 ENCOUNTER — Ambulatory Visit
Admission: RE | Admit: 2023-11-05 | Discharge: 2023-11-05 | Disposition: A | Discharge status: Home / Self Care | Source: Ambulatory Visit | Attending: Radiation Oncology | Admitting: Radiation Oncology

## 2023-11-05 ENCOUNTER — Other Ambulatory Visit: Payer: Self-pay

## 2023-11-05 DIAGNOSIS — Z510 Encounter for antineoplastic radiation therapy: Secondary | ICD-10-CM | POA: Diagnosis not present

## 2023-11-05 LAB — RAD ONC ARIA SESSION SUMMARY
Course Elapsed Days: 0
Plan Fractions Treated to Date: 1
Plan Prescribed Dose Per Fraction: 2.66 Gy
Plan Total Fractions Prescribed: 16
Plan Total Prescribed Dose: 42.56 Gy
Reference Point Dosage Given to Date: 2.66 Gy
Reference Point Session Dosage Given: 2.66 Gy
Session Number: 1

## 2023-11-06 ENCOUNTER — Other Ambulatory Visit: Payer: Self-pay

## 2023-11-06 ENCOUNTER — Ambulatory Visit
Admission: RE | Admit: 2023-11-06 | Discharge: 2023-11-06 | Disposition: A | Discharge status: Home / Self Care | Source: Ambulatory Visit | Attending: Radiation Oncology | Admitting: Radiation Oncology

## 2023-11-06 DIAGNOSIS — Z510 Encounter for antineoplastic radiation therapy: Secondary | ICD-10-CM | POA: Diagnosis not present

## 2023-11-06 DIAGNOSIS — Z170 Estrogen receptor positive status [ER+]: Secondary | ICD-10-CM

## 2023-11-06 LAB — RAD ONC ARIA SESSION SUMMARY
Course Elapsed Days: 1
Plan Fractions Treated to Date: 2
Plan Prescribed Dose Per Fraction: 2.66 Gy
Plan Total Fractions Prescribed: 16
Plan Total Prescribed Dose: 42.56 Gy
Reference Point Dosage Given to Date: 5.32 Gy
Reference Point Session Dosage Given: 2.66 Gy
Session Number: 2

## 2023-11-06 MED ORDER — ALRA NON-METALLIC DEODORANT (RAD-ONC)
1.0000 | Freq: Once | TOPICAL | Status: AC
  Administered 2023-11-06: 1 via TOPICAL

## 2023-11-06 MED ORDER — RADIAPLEXRX EX GEL: Freq: Once | CUTANEOUS | Status: AC

## 2023-11-09 ENCOUNTER — Other Ambulatory Visit: Payer: Self-pay

## 2023-11-09 ENCOUNTER — Ambulatory Visit
Admission: RE | Admit: 2023-11-09 | Discharge: 2023-11-09 | Disposition: A | Discharge status: Home / Self Care | Source: Ambulatory Visit | Attending: Radiation Oncology | Admitting: Radiation Oncology

## 2023-11-09 DIAGNOSIS — Z510 Encounter for antineoplastic radiation therapy: Secondary | ICD-10-CM | POA: Diagnosis not present

## 2023-11-09 LAB — RAD ONC ARIA SESSION SUMMARY
Course Elapsed Days: 4
Plan Fractions Treated to Date: 3
Plan Prescribed Dose Per Fraction: 2.66 Gy
Plan Total Fractions Prescribed: 16
Plan Total Prescribed Dose: 42.56 Gy
Reference Point Dosage Given to Date: 7.98 Gy
Reference Point Session Dosage Given: 2.66 Gy
Session Number: 3

## 2023-11-10 ENCOUNTER — Ambulatory Visit
Admission: RE | Admit: 2023-11-10 | Discharge: 2023-11-10 | Disposition: A | Discharge status: Home / Self Care | Source: Ambulatory Visit | Attending: Radiation Oncology

## 2023-11-10 ENCOUNTER — Other Ambulatory Visit: Payer: Self-pay

## 2023-11-10 DIAGNOSIS — Z510 Encounter for antineoplastic radiation therapy: Secondary | ICD-10-CM | POA: Diagnosis not present

## 2023-11-10 LAB — RAD ONC ARIA SESSION SUMMARY
Course Elapsed Days: 5
Plan Fractions Treated to Date: 4
Plan Prescribed Dose Per Fraction: 2.66 Gy
Plan Total Fractions Prescribed: 16
Plan Total Prescribed Dose: 42.56 Gy
Reference Point Dosage Given to Date: 10.64 Gy
Reference Point Session Dosage Given: 2.66 Gy
Session Number: 4

## 2023-11-11 ENCOUNTER — Ambulatory Visit
Admission: RE | Admit: 2023-11-11 | Discharge: 2023-11-11 | Disposition: A | Discharge status: Home / Self Care | Source: Ambulatory Visit | Attending: Radiation Oncology

## 2023-11-11 ENCOUNTER — Other Ambulatory Visit: Payer: Self-pay

## 2023-11-11 DIAGNOSIS — Z510 Encounter for antineoplastic radiation therapy: Secondary | ICD-10-CM | POA: Diagnosis not present

## 2023-11-11 LAB — RAD ONC ARIA SESSION SUMMARY
Course Elapsed Days: 6
Plan Fractions Treated to Date: 5
Plan Prescribed Dose Per Fraction: 2.66 Gy
Plan Total Fractions Prescribed: 16
Plan Total Prescribed Dose: 42.56 Gy
Reference Point Dosage Given to Date: 13.3 Gy
Reference Point Session Dosage Given: 2.66 Gy
Session Number: 5

## 2023-11-12 ENCOUNTER — Other Ambulatory Visit: Payer: Self-pay

## 2023-11-12 ENCOUNTER — Ambulatory Visit
Admission: RE | Admit: 2023-11-12 | Discharge: 2023-11-12 | Disposition: A | Discharge status: Home / Self Care | Source: Ambulatory Visit | Attending: Radiation Oncology | Admitting: Radiation Oncology

## 2023-11-12 DIAGNOSIS — Z510 Encounter for antineoplastic radiation therapy: Secondary | ICD-10-CM | POA: Diagnosis not present

## 2023-11-12 LAB — RAD ONC ARIA SESSION SUMMARY
Course Elapsed Days: 7
Plan Fractions Treated to Date: 6
Plan Prescribed Dose Per Fraction: 2.66 Gy
Plan Total Fractions Prescribed: 16
Plan Total Prescribed Dose: 42.56 Gy
Reference Point Dosage Given to Date: 15.96 Gy
Reference Point Session Dosage Given: 2.66 Gy
Session Number: 6

## 2023-11-13 ENCOUNTER — Ambulatory Visit

## 2023-11-14 ENCOUNTER — Other Ambulatory Visit: Payer: Self-pay

## 2023-11-16 ENCOUNTER — Ambulatory Visit
Admission: RE | Admit: 2023-11-16 | Discharge: 2023-11-16 | Discharge status: Home / Self Care | Source: Ambulatory Visit | Attending: Radiation Oncology

## 2023-11-16 ENCOUNTER — Ambulatory Visit
Admission: RE | Admit: 2023-11-16 | Discharge: 2023-11-16 | Disposition: A | Discharge status: Home / Self Care | Source: Ambulatory Visit | Attending: Radiation Oncology | Admitting: Radiation Oncology

## 2023-11-16 ENCOUNTER — Other Ambulatory Visit: Payer: Self-pay

## 2023-11-16 DIAGNOSIS — Z510 Encounter for antineoplastic radiation therapy: Secondary | ICD-10-CM | POA: Diagnosis not present

## 2023-11-16 LAB — RAD ONC ARIA SESSION SUMMARY
Course Elapsed Days: 11
Plan Fractions Treated to Date: 7
Plan Prescribed Dose Per Fraction: 2.66 Gy
Plan Total Fractions Prescribed: 16
Plan Total Prescribed Dose: 42.56 Gy
Reference Point Dosage Given to Date: 18.62 Gy
Reference Point Session Dosage Given: 2.66 Gy
Session Number: 7

## 2023-11-17 ENCOUNTER — Ambulatory Visit

## 2023-11-17 ENCOUNTER — Ambulatory Visit
Admission: RE | Admit: 2023-11-17 | Discharge: 2023-11-17 | Disposition: A | Discharge status: Home / Self Care | Source: Ambulatory Visit | Attending: Radiation Oncology | Admitting: Radiation Oncology

## 2023-11-17 ENCOUNTER — Other Ambulatory Visit: Payer: Self-pay

## 2023-11-17 DIAGNOSIS — Z510 Encounter for antineoplastic radiation therapy: Secondary | ICD-10-CM | POA: Diagnosis present

## 2023-11-17 DIAGNOSIS — C50412 Malignant neoplasm of upper-outer quadrant of left female breast: Secondary | ICD-10-CM | POA: Diagnosis present

## 2023-11-17 DIAGNOSIS — Z170 Estrogen receptor positive status [ER+]: Secondary | ICD-10-CM | POA: Insufficient documentation

## 2023-11-17 LAB — RAD ONC ARIA SESSION SUMMARY
Course Elapsed Days: 12
Plan Fractions Treated to Date: 8
Plan Prescribed Dose Per Fraction: 2.66 Gy
Plan Total Fractions Prescribed: 16
Plan Total Prescribed Dose: 42.56 Gy
Reference Point Dosage Given to Date: 21.28 Gy
Reference Point Session Dosage Given: 2.66 Gy
Session Number: 8

## 2023-11-18 ENCOUNTER — Other Ambulatory Visit: Payer: Self-pay

## 2023-11-18 ENCOUNTER — Ambulatory Visit
Admission: RE | Admit: 2023-11-18 | Discharge: 2023-11-18 | Disposition: A | Discharge status: Home / Self Care | Source: Ambulatory Visit | Attending: Radiation Oncology

## 2023-11-18 DIAGNOSIS — Z510 Encounter for antineoplastic radiation therapy: Secondary | ICD-10-CM | POA: Diagnosis not present

## 2023-11-18 LAB — RAD ONC ARIA SESSION SUMMARY
Course Elapsed Days: 13
Plan Fractions Treated to Date: 9
Plan Prescribed Dose Per Fraction: 2.66 Gy
Plan Total Fractions Prescribed: 16
Plan Total Prescribed Dose: 42.56 Gy
Reference Point Dosage Given to Date: 23.94 Gy
Reference Point Session Dosage Given: 2.66 Gy
Session Number: 9

## 2023-11-19 ENCOUNTER — Other Ambulatory Visit: Payer: Self-pay

## 2023-11-19 ENCOUNTER — Ambulatory Visit
Admission: RE | Admit: 2023-11-19 | Discharge: 2023-11-19 | Disposition: A | Discharge status: Home / Self Care | Source: Ambulatory Visit | Attending: Radiation Oncology | Admitting: Radiation Oncology

## 2023-11-19 DIAGNOSIS — Z510 Encounter for antineoplastic radiation therapy: Secondary | ICD-10-CM | POA: Diagnosis not present

## 2023-11-19 LAB — RAD ONC ARIA SESSION SUMMARY
Course Elapsed Days: 14
Plan Fractions Treated to Date: 10
Plan Prescribed Dose Per Fraction: 2.66 Gy
Plan Total Fractions Prescribed: 16
Plan Total Prescribed Dose: 42.56 Gy
Reference Point Dosage Given to Date: 26.6 Gy
Reference Point Session Dosage Given: 2.66 Gy
Session Number: 10

## 2023-11-23 ENCOUNTER — Other Ambulatory Visit: Payer: Self-pay

## 2023-11-23 ENCOUNTER — Ambulatory Visit
Admission: RE | Admit: 2023-11-23 | Discharge: 2023-11-23 | Disposition: A | Discharge status: Home / Self Care | Source: Ambulatory Visit | Attending: Radiation Oncology

## 2023-11-23 DIAGNOSIS — Z510 Encounter for antineoplastic radiation therapy: Secondary | ICD-10-CM | POA: Diagnosis not present

## 2023-11-23 LAB — RAD ONC ARIA SESSION SUMMARY
Course Elapsed Days: 18
Plan Fractions Treated to Date: 11
Plan Prescribed Dose Per Fraction: 2.66 Gy
Plan Total Fractions Prescribed: 16
Plan Total Prescribed Dose: 42.56 Gy
Reference Point Dosage Given to Date: 29.26 Gy
Reference Point Session Dosage Given: 2.66 Gy
Session Number: 11

## 2023-11-24 ENCOUNTER — Ambulatory Visit
Admission: RE | Admit: 2023-11-24 | Discharge: 2023-11-24 | Disposition: A | Discharge status: Home / Self Care | Source: Ambulatory Visit | Attending: Radiation Oncology | Admitting: Radiation Oncology

## 2023-11-24 ENCOUNTER — Other Ambulatory Visit: Payer: Self-pay

## 2023-11-24 DIAGNOSIS — Z510 Encounter for antineoplastic radiation therapy: Secondary | ICD-10-CM | POA: Diagnosis not present

## 2023-11-24 LAB — RAD ONC ARIA SESSION SUMMARY
Course Elapsed Days: 19
Plan Fractions Treated to Date: 12
Plan Prescribed Dose Per Fraction: 2.66 Gy
Plan Total Fractions Prescribed: 16
Plan Total Prescribed Dose: 42.56 Gy
Reference Point Dosage Given to Date: 31.92 Gy
Reference Point Session Dosage Given: 2.66 Gy
Session Number: 12

## 2023-11-25 ENCOUNTER — Ambulatory Visit
Admission: RE | Admit: 2023-11-25 | Discharge: 2023-11-25 | Disposition: A | Discharge status: Home / Self Care | Source: Ambulatory Visit | Attending: Radiation Oncology

## 2023-11-25 ENCOUNTER — Other Ambulatory Visit: Payer: Self-pay

## 2023-11-25 DIAGNOSIS — Z510 Encounter for antineoplastic radiation therapy: Secondary | ICD-10-CM | POA: Diagnosis not present

## 2023-11-25 LAB — RAD ONC ARIA SESSION SUMMARY
Course Elapsed Days: 20
Plan Fractions Treated to Date: 13
Plan Prescribed Dose Per Fraction: 2.66 Gy
Plan Total Fractions Prescribed: 16
Plan Total Prescribed Dose: 42.56 Gy
Reference Point Dosage Given to Date: 34.58 Gy
Reference Point Session Dosage Given: 2.66 Gy
Session Number: 13

## 2023-11-26 ENCOUNTER — Other Ambulatory Visit: Payer: Self-pay

## 2023-11-26 ENCOUNTER — Ambulatory Visit
Admission: RE | Admit: 2023-11-26 | Discharge: 2023-11-26 | Disposition: A | Discharge status: Home / Self Care | Source: Ambulatory Visit | Attending: Radiation Oncology | Admitting: Radiation Oncology

## 2023-11-26 DIAGNOSIS — Z510 Encounter for antineoplastic radiation therapy: Secondary | ICD-10-CM | POA: Diagnosis not present

## 2023-11-26 LAB — RAD ONC ARIA SESSION SUMMARY
Course Elapsed Days: 21
Plan Fractions Treated to Date: 14
Plan Prescribed Dose Per Fraction: 2.66 Gy
Plan Total Fractions Prescribed: 16
Plan Total Prescribed Dose: 42.56 Gy
Reference Point Dosage Given to Date: 37.24 Gy
Reference Point Session Dosage Given: 2.66 Gy
Session Number: 14

## 2023-11-27 ENCOUNTER — Ambulatory Visit
Admission: RE | Admit: 2023-11-27 | Discharge: 2023-11-27 | Disposition: A | Discharge status: Home / Self Care | Source: Ambulatory Visit | Attending: Radiation Oncology

## 2023-11-27 ENCOUNTER — Other Ambulatory Visit: Payer: Self-pay

## 2023-11-27 ENCOUNTER — Ambulatory Visit: Admitting: Radiation Oncology

## 2023-11-27 DIAGNOSIS — Z510 Encounter for antineoplastic radiation therapy: Secondary | ICD-10-CM | POA: Diagnosis not present

## 2023-11-27 LAB — RAD ONC ARIA SESSION SUMMARY
Course Elapsed Days: 22
Plan Fractions Treated to Date: 15
Plan Prescribed Dose Per Fraction: 2.66 Gy
Plan Total Fractions Prescribed: 16
Plan Total Prescribed Dose: 42.56 Gy
Reference Point Dosage Given to Date: 39.9 Gy
Reference Point Session Dosage Given: 2.66 Gy
Session Number: 15

## 2023-11-30 ENCOUNTER — Ambulatory Visit
Admission: RE | Admit: 2023-11-30 | Discharge: 2023-11-30 | Disposition: A | Discharge status: Home / Self Care | Source: Ambulatory Visit | Attending: Radiation Oncology | Admitting: Radiation Oncology

## 2023-11-30 ENCOUNTER — Other Ambulatory Visit: Payer: Self-pay

## 2023-11-30 ENCOUNTER — Ambulatory Visit

## 2023-11-30 DIAGNOSIS — Z510 Encounter for antineoplastic radiation therapy: Secondary | ICD-10-CM | POA: Diagnosis not present

## 2023-11-30 LAB — RAD ONC ARIA SESSION SUMMARY
Course Elapsed Days: 25
Plan Fractions Treated to Date: 16
Plan Prescribed Dose Per Fraction: 2.66 Gy
Plan Total Fractions Prescribed: 16
Plan Total Prescribed Dose: 42.56 Gy
Reference Point Dosage Given to Date: 42.56 Gy
Reference Point Session Dosage Given: 2.66 Gy
Session Number: 16

## 2023-12-01 ENCOUNTER — Other Ambulatory Visit: Payer: Self-pay

## 2023-12-01 ENCOUNTER — Ambulatory Visit

## 2023-12-01 ENCOUNTER — Inpatient Hospital Stay: Attending: Hematology | Admitting: Hematology

## 2023-12-01 ENCOUNTER — Ambulatory Visit
Admission: RE | Admit: 2023-12-01 | Discharge: 2023-12-01 | Disposition: A | Discharge status: Home / Self Care | Source: Ambulatory Visit | Attending: Radiation Oncology

## 2023-12-01 DIAGNOSIS — Z510 Encounter for antineoplastic radiation therapy: Secondary | ICD-10-CM | POA: Diagnosis not present

## 2023-12-01 LAB — RAD ONC ARIA SESSION SUMMARY
Course Elapsed Days: 26
Plan Fractions Treated to Date: 1
Plan Prescribed Dose Per Fraction: 2 Gy
Plan Total Fractions Prescribed: 4
Plan Total Prescribed Dose: 8 Gy
Reference Point Dosage Given to Date: 2 Gy
Reference Point Session Dosage Given: 2 Gy
Session Number: 17

## 2023-12-01 NOTE — Assessment & Plan Note (Deleted)
 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 Underwent lumpectomy on May 24, 2023. Tumor size 2.4 cm. Sentinel lymph node biopsy attempted but no sentinel lymph nodes found. Family history of breast cancer in a cousin.  -Her biopsy slides for stains and reviewed in our pathology lab, which confirmed invasive ductal carcinoma, and a DCIS.  Grade 2.  ER 40% with moderate staining, PR 40% moderate to strong staining, HER2 (-). 2 SLN were negative  -Oncotype RS 33, with 21% predicted risk of distant recurrence with tamoxifen in the next 9 years.  Adjuvant chemotherapy is recommended -I also discussed the clinical trial option with her  -She has met our surgeon Dr. Ebbie and had port placed  -she started adjuvant chemotherapy TC every 3 weeks for 4 cycles on 07/28/2023 and completed on 09/30/2023 -she started adjuvant RT on 11/05/2023

## 2023-12-02 ENCOUNTER — Ambulatory Visit
Admission: RE | Admit: 2023-12-02 | Discharge: 2023-12-02 | Disposition: A | Discharge status: Home / Self Care | Source: Ambulatory Visit | Attending: Radiation Oncology | Admitting: Radiation Oncology

## 2023-12-02 ENCOUNTER — Other Ambulatory Visit: Payer: Self-pay | Admitting: General Surgery

## 2023-12-02 ENCOUNTER — Other Ambulatory Visit: Payer: Self-pay

## 2023-12-02 DIAGNOSIS — Z510 Encounter for antineoplastic radiation therapy: Secondary | ICD-10-CM | POA: Diagnosis not present

## 2023-12-02 LAB — RAD ONC ARIA SESSION SUMMARY
Course Elapsed Days: 27
Plan Fractions Treated to Date: 2
Plan Prescribed Dose Per Fraction: 2 Gy
Plan Total Fractions Prescribed: 4
Plan Total Prescribed Dose: 8 Gy
Reference Point Dosage Given to Date: 4 Gy
Reference Point Session Dosage Given: 2 Gy
Session Number: 18

## 2023-12-03 ENCOUNTER — Encounter (HOSPITAL_BASED_OUTPATIENT_CLINIC_OR_DEPARTMENT_OTHER): Payer: Self-pay | Admitting: General Surgery

## 2023-12-03 ENCOUNTER — Ambulatory Visit

## 2023-12-03 ENCOUNTER — Other Ambulatory Visit: Payer: Self-pay

## 2023-12-03 ENCOUNTER — Ambulatory Visit
Admission: RE | Admit: 2023-12-03 | Discharge: 2023-12-03 | Disposition: A | Discharge status: Home / Self Care | Source: Ambulatory Visit | Attending: Radiation Oncology | Admitting: Radiation Oncology

## 2023-12-03 DIAGNOSIS — Z510 Encounter for antineoplastic radiation therapy: Secondary | ICD-10-CM | POA: Diagnosis not present

## 2023-12-03 LAB — RAD ONC ARIA SESSION SUMMARY
Course Elapsed Days: 28
Plan Fractions Treated to Date: 3
Plan Prescribed Dose Per Fraction: 2 Gy
Plan Total Fractions Prescribed: 4
Plan Total Prescribed Dose: 8 Gy
Reference Point Dosage Given to Date: 6 Gy
Reference Point Session Dosage Given: 2 Gy
Session Number: 19

## 2023-12-04 ENCOUNTER — Ambulatory Visit
Admission: RE | Admit: 2023-12-04 | Discharge: 2023-12-04 | Disposition: A | Discharge status: Home / Self Care | Source: Ambulatory Visit | Attending: Radiation Oncology | Admitting: Radiation Oncology

## 2023-12-04 ENCOUNTER — Other Ambulatory Visit: Payer: Self-pay

## 2023-12-04 DIAGNOSIS — Z510 Encounter for antineoplastic radiation therapy: Secondary | ICD-10-CM | POA: Diagnosis not present

## 2023-12-04 LAB — RAD ONC ARIA SESSION SUMMARY
Course Elapsed Days: 29
Plan Fractions Treated to Date: 4
Plan Prescribed Dose Per Fraction: 2 Gy
Plan Total Fractions Prescribed: 4
Plan Total Prescribed Dose: 8 Gy
Reference Point Dosage Given to Date: 8 Gy
Reference Point Session Dosage Given: 2 Gy
Session Number: 20

## 2023-12-04 MED ORDER — CHLORHEXIDINE GLUCONATE CLOTH 2 % EX PADS: 6.0000 | MEDICATED_PAD | Freq: Once | CUTANEOUS | Status: DC

## 2023-12-04 NOTE — Progress Notes (Signed)

## 2023-12-07 NOTE — Radiation Completion Notes (Signed)
  Radiation Oncology         [phone] ________________________________  Name: Lylliana Kitamura MRN: [id number]  Date of Service: 12/04/2023  DOB: [birth year]  End of Treatment Note  Diagnosis: Stage IA, pT2N0M0, grade 2, ER/PR positive invasive ductal carcinoma of the left breast   Intent: Curative     ==========DELIVERED PLANS==========  First Treatment Date: 2023-11-05 Last Treatment Date: 2023-12-04   Plan Name: Breast_L_BH Site: Breast, Left Technique: 3D Mode: Photon Dose Per Fraction: 2.66 Gy Prescribed Dose (Delivered / Prescribed): 42.56 Gy / 42.56 Gy Prescribed Fxs (Delivered / Prescribed): 16 / 16   Plan Name: Brst_L_Bst_BH Site: Breast, Left Technique: 3D Mode: Photon Dose Per Fraction: 2 Gy Prescribed Dose (Delivered / Prescribed): 8 Gy / 8 Gy Prescribed Fxs (Delivered / Prescribed): 4 / 4     ==========ON TREATMENT VISIT DATES========== 2023-11-06, 2023-11-16, 2023-11-27, 2023-12-04    See weekly On Treatment Notes in Epic for details in the Media tab (listed as Progress notes on the On Treatment Visit Dates listed above). The patient tolerated radiation. She developed fatigue and anticipated skin changes in the treatment field.   The patient will receive a call in about one month from the radiation oncology department. She will continue follow up with Dr. Lanny as well.      Donald KYM Husband, PAC

## 2023-12-08 ENCOUNTER — Telehealth: Payer: Self-pay

## 2023-12-08 ENCOUNTER — Inpatient Hospital Stay (HOSPITAL_BASED_OUTPATIENT_CLINIC_OR_DEPARTMENT_OTHER): Admitting: Hematology

## 2023-12-08 ENCOUNTER — Telehealth: Payer: Self-pay | Admitting: Hematology

## 2023-12-08 DIAGNOSIS — Z79810 Long term (current) use of selective estrogen receptor modulators (SERMs): Secondary | ICD-10-CM | POA: Insufficient documentation

## 2023-12-08 DIAGNOSIS — Z1732 Human epidermal growth factor receptor 2 negative status: Secondary | ICD-10-CM | POA: Diagnosis not present

## 2023-12-08 DIAGNOSIS — C50412 Malignant neoplasm of upper-outer quadrant of left female breast: Secondary | ICD-10-CM

## 2023-12-08 DIAGNOSIS — Z170 Estrogen receptor positive status [ER+]: Secondary | ICD-10-CM

## 2023-12-08 MED ORDER — TAMOXIFEN CITRATE 20 MG PO TABS: 20.0000 mg | ORAL_TABLET | Freq: Every day | ORAL | 2 refills | Status: DC

## 2023-12-08 NOTE — Assessment & Plan Note (Signed)
 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 Underwent lumpectomy on May 24, 2023. Tumor size 2.4 cm. Sentinel lymph node biopsy attempted but no sentinel lymph nodes found. Family history of breast cancer in a cousin.  -Her biopsy slides for stains and reviewed in our pathology lab, which confirmed invasive ductal carcinoma, and a DCIS.  Grade 2.  ER 40% with moderate staining, PR 40% moderate to strong staining, HER2 (-). 2 SLN were negative  -Oncotype RS 33, with 21% predicted risk of distant recurrence with tamoxifen  in the next 9 years.  Adjuvant chemotherapy is recommended -I also discussed the clinical trial option with her  -She has met our surgeon Dr. Ebbie and had port placed  -she started adjuvant chemotherapy TC every 3 weeks for 4 cycles on 07/28/2023 and completed on 09/30/2023 -she started adjuvant RT on 11/05/2023 and completed on 12/04/2023 -will start adjuvant Tamoxifen 

## 2023-12-08 NOTE — Telephone Encounter (Signed)
 Pt called requesting if Dr. Lanny would give her a call.  Pt stated she's having surgery tomorrow and would like to speak with Dr. Lanny before having surgery on 12/09/2023.  Notified Dr. Lanny of the pt's call.

## 2023-12-08 NOTE — Progress Notes (Signed)
 [facility]   [phone] [phone]   Clinic Follow up Note   Patient Care Team: Austin Mutton, MD as PCP - General (Internal Medicine) Glean Stephane BROCKS, RN (Inactive) as Oncology Nurse Navigator Tyree Nanetta SAILOR, RN as Oncology Nurse Navigator Lanny Callander, MD as Consulting Physician (Hematology) 12/08/2023  I connected with Alexis [name] on 12/08/23 at  1:00 PM EDT by telephone and verified that I am speaking with the correct person using two identifiers.   I discussed the limitations, risks, security and privacy concerns of performing an evaluation and management service by telephone and the availability of in person appointments. I also discussed with the patient that there may be a patient responsible charge related to this service. The patient expressed understanding and agreed to proceed.   Patient's location: Home Provider's location:  Office    CHIEF COMPLAINT: Follow-up breast care   CURRENT THERAPY: Pending adjuvant tamoxifen   Oncology history Malignant neoplasm of upper-outer quadrant of left breast in female, estrogen receptor positive (HCC) pT2N0M0, stage IA, ER+/PR+/HER2- 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 Underwent lumpectomy on May 24, 2023. Tumor size 2.4 cm. Sentinel lymph node biopsy attempted but no sentinel lymph nodes found. Family history of breast cancer in a cousin.  -Her biopsy slides for stains and reviewed in our pathology lab, which confirmed invasive ductal carcinoma, and a DCIS.  Grade 2.  ER 40% with moderate staining, PR 40% moderate to strong staining, HER2 (-). 2 SLN were negative  -Oncotype RS 33, with 21% predicted risk of distant recurrence with tamoxifen  in the next 9 years.  Adjuvant chemotherapy is recommended -I also discussed the clinical trial option with her  -She has met our surgeon Dr. Ebbie and had port placed  -she started  adjuvant chemotherapy TC every 3 weeks for 4 cycles on 07/28/2023 and completed on 09/30/2023 -she started adjuvant RT on 11/05/2023 and completed on 12/04/2023 -will start adjuvant Tamoxifen    Assessment & Plan Breast cancer Completed radiation therapy with mild skin irritation. No signs of infection, discharge, lymphedema, neuropathy, or residual chemotherapy side effects. Hair regrowth noted. Menstruation has not resumed post-chemotherapy, consistent with chemotherapy-induced menopause. Discussed minimal residual disease testing for early detection of recurrence, suitable for younger patients with moderate recurrence risk (20-25%). - Proceed with port removal as scheduled with Dr. Ebbie. - Schedule a survivorship visit with nurse practitioner Lazy in October. - Consider MRD (minimal residual disease) testing for early detection of recurrence.  Planned tamoxifen  therapy Plan to initiate tamoxifen  therapy in mid-August post-port removal and resolution of skin toxicity. Tamoxifen  is an estrogen-blocking [name] with potential menopausal symptoms, including hot flashes. Discussed risks of blood clots and advised vigilance for symptoms like leg swelling or chest pain. Discussed impact on metabolism and potential weight gain, with advice on dietary and exercise measures. - Prescribe tamoxifen  20 mg daily, to start mid-August. - Advise on monitoring for symptoms of blood clots and to seek immediate medical attention if she occurs. - Instruct to pause tamoxifen  if undergoing surgery or long-distance travel. - Encourage regular exercise and a balanced diet to manage potential weight gain.  Skin irritation due to radiation therapy Mild skin irritation post-radiation therapy, including redness and darkening of the skin. No severe symptoms reported.  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 Symptoms include hot flashes  due to ovarian function suppression. Discussed potential recovery of ovarian function and return of menstruation.  Plan - She will proceed with port removal tomorrow - I called in tamoxifen  20 mg daily for her, she will start in about 3 weeks - Survivorship with NP Armida in 3 months, lab and follow-up with me in 6 months - Plan to monitor with ctDNA GuardantReveal q8m   SUMMARY OF ONCOLOGIC HISTORY: Oncology History  Malignant neoplasm of upper-outer quadrant of left breast in female, estrogen receptor positive (HCC)  05/24/2022 Surgery   Surgical pathology: Left lumpectomy, tumor size 2.3 x 2.0 x 1.8 cm, invasive ductal carcinoma with mucinous.  Grade 2,  (+) DCIS G2. (+).  Lymphovascular invasion (+), perineural invasion(-).  Surgical margins were negative.  No lymph nodes seen in the surgical sample.  ER 75% positive, moderate to strong staining, PR 20% positive, moderate staining, HER2 2+, HER2 negative by FISH.  Ki-67 40%   05/13/2023 Initial Biopsy   Biopsy of left breast mass: invasive ductal carcinoma,   IHC: ER +90% strong staining, PR 30% positive, moderate staining, HER2 2+, Ki-67 25%, e-cadherin (+)  Left axillary node biopsy: negative for malignant cells    05/21/2023 Imaging   Mammogram showed a irregular 2.2 x 2.0 cm mass in the upper outer quadrant of left breast, 3.3 cm from nipple, with irregular border and calcification, BI-RAD 4b, biopsy recommended.  In the right breast, there is a 1 cm nodule, likely benign, and scattered calcification, likely benign.    05/24/2023 Cancer Staging   Staging form: Breast, AJCC 8th Edition - Pathologic stage from 05/24/2023: Stage IA (pT2, pN0, cM0, G2, ER+, PR+, HER2-) - Signed by Lanny Callander, MD on 06/16/2023 Histologic grading system: 3 grade system Residual tumor (R): R0 - None   06/16/2023 Initial Diagnosis   Malignant neoplasm of upper-outer quadrant of left breast in female, estrogen receptor positive (HCC)   07/29/2023 -  Chemotherapy    Patient is on Treatment Plan : BREAST TC q21d       Discussed the use of AI scribe software for clinical note transcription with the patient, who gave verbal consent to proceed.  History of Present Illness Alexis [name] is a 43 year old female with breast cancer who presents for a follow-up visit post-radiation therapy.  She experiences fatigue and skin irritation, including redness and darkening, following the completion of radiation therapy. The area around her port is itchy with occasional pulling sensations, but there are no signs of infection. The port is scheduled for removal tomorrow.  There are no residual chemotherapy side effects. Her hair is regrowing, and she experiences no neuropathy. She denies any lymphedema in her left arm.     REVIEW OF SYSTEMS:   Constitutional: Denies fevers, chills or abnormal weight loss Eyes: Denies blurriness of vision Ears, nose, mouth, throat, and face: Denies mucositis or sore throat Respiratory: Denies cough, dyspnea or wheezes Cardiovascular: Denies palpitation, chest discomfort or lower extremity swelling Gastrointestinal:  Denies nausea, heartburn or change in bowel habits Skin: Denies abnormal skin rashes Lymphatics: Denies new lymphadenopathy or easy bruising Neurological:Denies numbness, tingling or new weaknesses Behavioral/Psych: Mood is stable, no new changes  All other systems were reviewed with the patient and are negative.  MEDICAL HISTORY:  Past Medical History:  Diagnosis Date   Cancer (HCC) 07/10/2023   Breast    SURGICAL HISTORY: Past Surgical History:  Procedure Laterality Date   APPENDECTOMY     BREAST LUMPECTOMY Left 05/24/2023   PORTACATH PLACEMENT Right 07/17/2023   Procedure: INSERTION PORT-A-CATH WITH ULTRASOUND GUIDANCE;  Surgeon: Ebbie Cough,  MD;  Location: [facility];  Service: General;  Laterality: Right;    I have reviewed the social history and family history with the patient and  they are unchanged from previous note.  ALLERGIES:  is allergic to apple.  MEDICATIONS:  Current Outpatient Medications  Medication Sig Dispense Refill   tamoxifen  (NOLVADEX ) 20 MG tablet Take 1 tablet (20 mg total) by mouth daily. 30 tablet 2   loratadine  (CLARITIN ) 10 MG tablet Take 10 mg by mouth daily as needed for allergies. (Patient not taking: Reported on 12/03/2023)     ondansetron  (ZOFRAN ) 8 MG tablet Take 1 tablet (8 mg total) by mouth every 8 (eight) hours as needed for nausea or vomiting. Start on the third day after chemotherapy. 30 tablet 1   prochlorperazine  (COMPAZINE ) 10 MG tablet Take 1 tablet (10 mg total) by mouth every 6 (six) hours as needed for nausea or vomiting. 30 tablet 1   No current facility-administered medications for this visit.   Facility-Administered Medications Ordered in Other Visits  Medication Dose Route Frequency Provider Last Rate Last Admin   Chlorhexidine  Gluconate Cloth 2 % PADS 6 each  6 each Topical Once Ebbie Cough, MD       And   Chlorhexidine  Gluconate Cloth 2 % PADS 6 each  6 each Topical Once Ebbie Cough, MD        PHYSICAL EXAMINATION: Not performed   LABORATORY DATA:  I have reviewed the data as listed    Latest Ref Rng & Units 09/30/2023    9:27 AM 09/09/2023    8:29 AM 08/19/2023    8:48 AM  CBC  WBC 4.0 - 10.5 K/uL 11.5  8.6  3.3   Hemoglobin 12.0 - 15.0 g/dL 89.6  89.5  89.9   Hematocrit 36.0 - 46.0 % 33.6  34.2  32.9   Platelets 150 - 400 K/uL 166  172  137         Latest Ref Rng & Units 09/30/2023    9:27 AM 09/09/2023    8:29 AM 08/19/2023    8:48 AM  CMP  Glucose 70 - 99 mg/dL 821  775  79   BUN 6 - 20 mg/dL 13  12  11    Creatinine 0.44 - 1.00 mg/dL 9.49  9.45  9.40   Sodium 135 - 145 mmol/L 141  140  142   Potassium 3.5 - 5.1 mmol/L 3.8  3.8  3.8   Chloride 98 - 111 mmol/L 106  107  109   CO2 22 - 32 mmol/L 27  26  28    Calcium 8.9 - 10.3 mg/dL 9.3  9.4  8.8   Total Protein 6.5 - 8.1 g/dL 6.5  6.6   6.0   Total Bilirubin 0.0 - 1.2 mg/dL 0.4  0.4  0.4   Alkaline Phos 38 - 126 U/L 76  77  58   AST 15 - 41 U/L 15  18  16    ALT 0 - 44 U/L 19  31  25        RADIOGRAPHIC STUDIES: I have personally reviewed the radiological images as listed and agreed with the findings in the report. No results found.     I discussed the assessment and treatment plan with the patient. The patient was provided an opportunity to ask questions and all were answered. The patient agreed with the plan and demonstrated an understanding of the instructions.   The patient was advised to call back or seek  an in-person evaluation if the symptoms worsen or if the condition fails to improve as anticipated.  I provided 25 minutes of non face-to-face telephone visit time during this encounter, including review of chart and various tests results, discussions about plan of care and coordination of care plan.    Onita Mattock, MD 12/08/23

## 2023-12-08 NOTE — Telephone Encounter (Signed)
 Scheduled appointment per staff message. Talked with the patient and she is aware of the made appointment.

## 2023-12-09 ENCOUNTER — Ambulatory Visit (HOSPITAL_BASED_OUTPATIENT_CLINIC_OR_DEPARTMENT_OTHER)
Admission: RE | Admit: 2023-12-09 | Discharge: 2023-12-09 | Disposition: A | Discharge status: Home / Self Care | Attending: General Surgery | Admitting: General Surgery

## 2023-12-09 ENCOUNTER — Other Ambulatory Visit: Payer: Self-pay

## 2023-12-09 ENCOUNTER — Ambulatory Visit (HOSPITAL_BASED_OUTPATIENT_CLINIC_OR_DEPARTMENT_OTHER): Admitting: Anesthesiology

## 2023-12-09 ENCOUNTER — Encounter (HOSPITAL_BASED_OUTPATIENT_CLINIC_OR_DEPARTMENT_OTHER)
Admission: RE | Disposition: A | Discharge status: Home / Self Care | Payer: Self-pay | Source: Home / Self Care | Attending: General Surgery

## 2023-12-09 ENCOUNTER — Encounter (HOSPITAL_BASED_OUTPATIENT_CLINIC_OR_DEPARTMENT_OTHER): Payer: Self-pay | Admitting: General Surgery

## 2023-12-09 DIAGNOSIS — C50919 Malignant neoplasm of unspecified site of unspecified female breast: Secondary | ICD-10-CM | POA: Insufficient documentation

## 2023-12-09 DIAGNOSIS — Z9221 Personal history of antineoplastic chemotherapy: Secondary | ICD-10-CM | POA: Diagnosis not present

## 2023-12-09 DIAGNOSIS — Z01818 Encounter for other preprocedural examination: Secondary | ICD-10-CM

## 2023-12-09 HISTORY — PX: PORT-A-CATH REMOVAL: SHX5289

## 2023-12-09 LAB — POCT PREGNANCY, URINE: Preg Test, Ur: NEGATIVE

## 2023-12-09 SURGERY — REMOVAL PORT-A-CATH
Anesthesia: Monitor Anesthesia Care | Laterality: Right

## 2023-12-09 MED ORDER — PROPOFOL 500 MG/50ML IV EMUL
INTRAVENOUS | Status: DC | PRN
  Administered 2023-12-09: 150 ug/kg/min via INTRAVENOUS

## 2023-12-09 MED ORDER — LIDOCAINE HCL (CARDIAC) PF 100 MG/5ML IV SOSY
PREFILLED_SYRINGE | INTRAVENOUS | Status: DC | PRN
  Administered 2023-12-09: 40 mg via INTRAVENOUS

## 2023-12-09 MED ORDER — ONDANSETRON HCL 4 MG/2ML IJ SOLN
4.0000 mg | Freq: Once | INTRAMUSCULAR | Status: AC | PRN
  Administered 2023-12-09: 4 mg via INTRAVENOUS

## 2023-12-09 MED ORDER — FENTANYL CITRATE (PF) 100 MCG/2ML IJ SOLN
INTRAMUSCULAR | Status: DC | PRN
  Administered 2023-12-09: 50 ug via INTRAVENOUS

## 2023-12-09 MED ORDER — BUPIVACAINE HCL (PF) 0.25 % IJ SOLN
INTRAMUSCULAR | Status: DC | PRN
  Administered 2023-12-09: 8 mL

## 2023-12-09 MED ORDER — ACETAMINOPHEN 500 MG PO TABS
ORAL_TABLET | ORAL | Status: AC
  Filled 2023-12-09: qty 2

## 2023-12-09 MED ORDER — AMISULPRIDE (ANTIEMETIC) 5 MG/2ML IV SOLN: 10.0000 mg | Freq: Once | INTRAVENOUS | Status: DC | PRN

## 2023-12-09 MED ORDER — CEFAZOLIN SODIUM-DEXTROSE 2-4 GM/100ML-% IV SOLN
INTRAVENOUS | Status: AC
  Filled 2023-12-09: qty 100

## 2023-12-09 MED ORDER — MIDAZOLAM HCL 5 MG/5ML IJ SOLN
INTRAMUSCULAR | Status: DC | PRN
  Administered 2023-12-09: 2 mg via INTRAVENOUS

## 2023-12-09 MED ORDER — MIDAZOLAM HCL 2 MG/2ML IJ SOLN
INTRAMUSCULAR | Status: AC
  Filled 2023-12-09: qty 2

## 2023-12-09 MED ORDER — LACTATED RINGERS IV SOLN: INTRAVENOUS | Status: DC

## 2023-12-09 MED ORDER — KETOROLAC TROMETHAMINE 30 MG/ML IJ SOLN: 30.0000 mg | Freq: Once | INTRAMUSCULAR | Status: DC | PRN

## 2023-12-09 MED ORDER — OXYCODONE HCL 5 MG/5ML PO SOLN: 5.0000 mg | Freq: Once | ORAL | Status: DC | PRN

## 2023-12-09 MED ORDER — HYDROMORPHONE HCL 1 MG/ML IJ SOLN: 0.2500 mg | INTRAMUSCULAR | Status: DC | PRN

## 2023-12-09 MED ORDER — PROPOFOL 10 MG/ML IV BOLUS
INTRAVENOUS | Status: DC | PRN
Start: 2023-12-09 — End: 2023-12-09
  Administered 2023-12-09: 20 mg via INTRAVENOUS

## 2023-12-09 MED ORDER — PROPOFOL 500 MG/50ML IV EMUL
INTRAVENOUS | Status: AC
  Filled 2023-12-09: qty 50

## 2023-12-09 MED ORDER — ACETAMINOPHEN 500 MG PO TABS
1000.0000 mg | ORAL_TABLET | ORAL | Status: AC
  Administered 2023-12-09: 1000 mg via ORAL

## 2023-12-09 MED ORDER — MEPERIDINE HCL 25 MG/ML IJ SOLN: 6.2500 mg | INTRAMUSCULAR | Status: DC | PRN

## 2023-12-09 MED ORDER — FENTANYL CITRATE (PF) 100 MCG/2ML IJ SOLN
INTRAMUSCULAR | Status: AC
  Filled 2023-12-09: qty 2

## 2023-12-09 MED ORDER — CEFAZOLIN SODIUM-DEXTROSE 2-4 GM/100ML-% IV SOLN
2.0000 g | INTRAVENOUS | Status: AC
  Administered 2023-12-09: 2 g via INTRAVENOUS

## 2023-12-09 MED ORDER — ONDANSETRON HCL 4 MG/2ML IJ SOLN
INTRAMUSCULAR | Status: AC
  Filled 2023-12-09: qty 2

## 2023-12-09 MED ORDER — OXYCODONE HCL 5 MG PO TABS: 5.0000 mg | ORAL_TABLET | Freq: Once | ORAL | Status: DC | PRN

## 2023-12-09 SURGICAL SUPPLY — 24 items
BLADE SURG 15 STRL LF DISP TIS (BLADE) ×1 IMPLANT
CHLORAPREP W/TINT 26 (MISCELLANEOUS) ×1 IMPLANT
COVER BACK TABLE 60X90IN (DRAPES) ×1 IMPLANT
COVER MAYO STAND STRL (DRAPES) ×1 IMPLANT
DERMABOND ADVANCED .7 DNX12 (GAUZE/BANDAGES/DRESSINGS) ×1 IMPLANT
DRAPE LAPAROTOMY 100X72 PEDS (DRAPES) ×1 IMPLANT
DRAPE UTILITY XL STRL (DRAPES) ×1 IMPLANT
ELECT COATED BLADE 2.86 ST (ELECTRODE) ×1 IMPLANT
ELECTRODE REM PT RTRN 9FT ADLT (ELECTROSURGICAL) ×1 IMPLANT
GLOVE BIO SURGEON STRL SZ7 (GLOVE) ×1 IMPLANT
GLOVE BIOGEL PI IND STRL 7.5 (GLOVE) ×1 IMPLANT
GOWN STRL REUS W/ TWL LRG LVL3 (GOWN DISPOSABLE) ×2 IMPLANT
NDL HYPO 25X1 1.5 SAFETY (NEEDLE) ×1 IMPLANT
NEEDLE HYPO 25X1 1.5 SAFETY (NEEDLE) ×1 IMPLANT
PACK BASIN DAY SURGERY FS (CUSTOM PROCEDURE TRAY) ×1 IMPLANT
PENCIL SMOKE EVACUATOR (MISCELLANEOUS) ×1 IMPLANT
SLEEVE SCD COMPRESS KNEE MED (STOCKING) IMPLANT
SPIKE FLUID TRANSFER (MISCELLANEOUS) ×1 IMPLANT
SPONGE T-LAP 4X18 ~~LOC~~+RFID (SPONGE) ×1 IMPLANT
STRIP CLOSURE SKIN 1/2X4 (GAUZE/BANDAGES/DRESSINGS) ×1 IMPLANT
SUT MNCRL AB 4-0 PS2 18 (SUTURE) ×1 IMPLANT
SUT VIC AB 3-0 SH 27X BRD (SUTURE) ×1 IMPLANT
SYR CONTROL 10ML LL (SYRINGE) ×1 IMPLANT
TOWEL GREEN STERILE FF (TOWEL DISPOSABLE) ×1 IMPLANT

## 2023-12-09 NOTE — Discharge Instructions (Addendum)
 [facility] Office Phone Number [phone]  POST OP INSTRUCTIONS Take 400 mg of ibuprofen every 8 hours or 650 mg tylenol  every 6 hours for next 72 hours then as needed. Use ice several times daily also.  A prescription for pain medication may be given to you upon discharge.  Take your pain medication as prescribed, if needed.  If narcotic pain medicine is not needed, then you may take acetaminophen  (Tylenol ), naprosyn (Alleve) or ibuprofen (Advil) as needed. Take your usually prescribed medications unless otherwise directed If you need a refill on your pain medication, please contact your pharmacy.  They will contact our office to request authorization.  Prescriptions will not be filled after 5pm or on week-ends. You should eat very light the first 24 hours after surgery, such as soup, crackers, pudding, etc.  Resume your normal diet the day after surgery. Most patients will experience some swelling and bruising  Ice packs will help.   Swelling and bruising can take several days to resolve.  It is common to experience some constipation if taking pain medication after surgery.  Increasing fluid intake and taking a stool softener will usually help or prevent this problem from occurring.  A mild laxative (Milk of Magnesia or Miralax) should be taken according to package directions if there are no bowel movements after 48 hours. I used skin glue on the incision, you may shower in 24 hours.  The glue will flake off over the next 2-3 weeks as will the steristrips.   Any sutures or staples will be removed at the office during your follow-up visit. ACTIVITIES:  You may resume regular daily activities (gradually increasing) beginning the next day.   You may drive when you no longer are taking prescription pain medication, you can comfortably wear a seatbelt, and you can safely maneuver your car and apply brakes. RETURN TO WORK:   ______________________________________________________________________________________  WHEN TO CALL DR WAKEFIELD: Fever over 101.0 Nausea and/or vomiting. Extreme swelling or bruising. Continued bleeding from incision. Increased pain, redness, or drainage from the incision.  The clinic staff is available to answer your questions during regular business hours.  Please don't hesitate to call and ask to speak to one of the nurses for clinical concerns.  If you have a medical emergency, go to the nearest emergency room or call 911.  A surgeon from [facility] is always on call at the hospital.  For further questions, please visit [email or web address]  No Tylenol  until 5:15pm today, if needed.

## 2023-12-09 NOTE — Anesthesia Postprocedure Evaluation (Signed)
 Anesthesia Post Note  Patient: Alexis [name]  Procedure(s) Performed: REMOVAL PORT-A-CATH (Right)     Patient location during evaluation: PACU Anesthesia Type: MAC Level of consciousness: awake and alert Pain management: pain level controlled Vital Signs Assessment: post-procedure vital signs reviewed and stable Respiratory status: spontaneous breathing, nonlabored ventilation and respiratory function stable Cardiovascular status: blood pressure returned to baseline and stable Postop Assessment: no apparent nausea or vomiting Anesthetic complications: no   No notable events documented.  Last Vitals:  Vitals:   12/09/23 1205 12/09/23 1250  BP: (!) 86/55 93/60  Pulse:  72  Resp:  15  Temp:  36.6 C  SpO2:  100%    Last Pain:  Vitals:   12/09/23 1250  TempSrc:   PainSc: 1                  Butler Levander Pinal

## 2023-12-09 NOTE — Op Note (Signed)
 Preoperative diagnosis: breast cancer, no longer needs venous access Postoperative diagnosis: saa Procedure: port removal Dr Adina Bury EBL minimal Anesthesia local with sedation Specimens none Drains none Dispo recovery stable.  Indications This is a 58 yof completed therapy for breast cancer here for port removal  Procedure: After informed consent obtained she was taken to the OR. She was given abx.  She was placed under MAC. She was prepped and draped in standard sterile surgical fashion.  Timeout performed.  I infiltrated local throughout the old incision.  I then reentered it. I removed the port, line and suture material. I obtained hemostasis. I closed with 3-0 vicryl and 4-0 monocryl I closed with glue. She tolerated well

## 2023-12-09 NOTE — Anesthesia Preprocedure Evaluation (Addendum)
 Anesthesia Evaluation  Patient identified by MRN, date of birth, ID band Patient awake    Reviewed: Allergy & Precautions, NPO status , Patient's Chart, lab work & pertinent test results  History of Anesthesia Complications Negative for: history of anesthetic complications  Airway Mallampati: II  TM Distance: >3 FB Neck ROM: Full    Dental  (+) Dental Advisory Given   Pulmonary neg pulmonary ROS   breath sounds clear to auscultation       Cardiovascular negative cardio ROS  Rhythm:Regular Rate:Normal     Neuro/Psych negative neurological ROS  negative psych ROS   GI/Hepatic negative GI ROS, Neg liver ROS,,,  Endo/Other  negative endocrine ROS    Renal/GU negative Renal ROS  negative genitourinary   Musculoskeletal negative musculoskeletal ROS (+)    Abdominal   Peds  Hematology negative hematology ROS (+)   Anesthesia Other Findings Breast cancer  Reproductive/Obstetrics negative OB ROS                              Anesthesia Physical Anesthesia Plan  ASA: 2  Anesthesia Plan: MAC   Post-op Pain Management: Minimal or no pain anticipated   Induction: Intravenous  PONV Risk Score and Plan: 2 and Treatment may vary due to age or medical condition and Propofol  infusion  Airway Management Planned: Simple Face Mask  Additional Equipment: None  Intra-op Plan:   Post-operative Plan:   Informed Consent: I have reviewed the patients History and Physical, chart, labs and discussed the procedure including the risks, benefits and alternatives for the proposed anesthesia with the patient or authorized representative who has indicated his/her understanding and acceptance.     Dental advisory given  Plan Discussed with: CRNA and Surgeon  Anesthesia Plan Comments:         Anesthesia Quick Evaluation

## 2023-12-09 NOTE — Transfer of Care (Signed)
 Immediate Anesthesia Transfer of Care Note  Patient: Alexis [name]  Procedure(s) Performed: REMOVAL PORT-A-CATH (Right)  Patient Location: PACU  Anesthesia Type:MAC  Level of Consciousness: sedated  Airway & Oxygen Therapy: Patient Spontanous Breathing  Post-op Assessment: Report given to RN and Post -op Vital signs reviewed and stable  Post vital signs: Reviewed and stable  Last Vitals:  Vitals Value Taken Time  BP 81/55 12/09/23 11:51  Temp    Pulse 58 12/09/23 11:52  Resp 14 12/09/23 11:52  SpO2 100 % 12/09/23 11:52  Vitals shown include unfiled device data.  Last Pain:  Vitals:   12/09/23 1113  TempSrc: Temporal  PainSc: 0-No pain         Complications: No notable events documented.

## 2023-12-09 NOTE — H&P (Signed)
 Alexis [name] is an 43 y.o. female.   Chief Complaint: breast cancer, no longer needs venous access HPI: 27 yof with lump/sn at OSH followed by chemotherapy for which I placed a port. She is doing well and we discussed port removal.   Past Medical History:  Diagnosis Date   Cancer (HCC) 07/10/2023   Breast    Past Surgical History:  Procedure Laterality Date   APPENDECTOMY     BREAST LUMPECTOMY Left 05/24/2023   PORTACATH PLACEMENT Right 07/17/2023   Procedure: INSERTION PORT-A-CATH WITH ULTRASOUND GUIDANCE;  Surgeon: Ebbie Cough, MD;  Location: [facility];  Service: General;  Laterality: Right;    Family History  Problem Relation Age of Onset   Hypertension Mother    Hypertension Father    Cancer Cousin 84       breast cancer   Social History:  reports that she has never smoked. She has never used smokeless tobacco. She reports current alcohol use. She reports that she does not use drugs.  Allergies:  Allergies  Allergen Reactions   Apple Swelling    Throat closes    No medications prior to admission.    No results found for this or any previous visit (from the past 48 hours). No results found.  Review of Systems  All other systems reviewed and are negative.   Height 5' 2 (1.575 m), weight 51 kg. Physical Exam Constitutional:      Appearance: Normal appearance.  Chest:     Comments: Right chest port in place Neurological:     Mental Status: She is alert.      Assessment/Plan Port removal   Cough Ebbie, MD 12/09/2023, 9:54 AM

## 2023-12-10 ENCOUNTER — Encounter (HOSPITAL_BASED_OUTPATIENT_CLINIC_OR_DEPARTMENT_OTHER): Payer: Self-pay | Admitting: General Surgery

## 2023-12-10 ENCOUNTER — Telehealth: Payer: Self-pay | Admitting: Hematology

## 2023-12-10 NOTE — Telephone Encounter (Signed)
 Scheduled appointments per 7/22 los. Talked with the patient and she is aware of the made appointments.

## 2023-12-11 ENCOUNTER — Other Ambulatory Visit: Payer: Self-pay

## 2024-01-04 ENCOUNTER — Ambulatory Visit
Admission: RE | Admit: 2024-01-04 | Discharge: 2024-01-04 | Disposition: A | Discharge status: Home / Self Care | Source: Ambulatory Visit | Attending: Internal Medicine | Admitting: Internal Medicine

## 2024-01-04 DIAGNOSIS — Z170 Estrogen receptor positive status [ER+]: Secondary | ICD-10-CM | POA: Insufficient documentation

## 2024-01-04 DIAGNOSIS — C50412 Malignant neoplasm of upper-outer quadrant of left female breast: Secondary | ICD-10-CM | POA: Insufficient documentation

## 2024-01-04 NOTE — Progress Notes (Signed)
  Radiation Oncology         [phone] ________________________________  Name: Alexis [name] MRN: [id number]  Date of Service: 01/04/2024  DOB: 29-May-1980  Post Treatment Telephone Note  Diagnosis: Stage IA, pT2N0M0, grade 2, ER/PR positive invasive ductal carcinoma of the left breast      The patient available for call today.   Symptoms of fatigue have improved since completing therapy.  Symptoms of skin changes have not improved since completing therapy. She says there is still some darkening underneath the armpit.  The patient was encouraged to avoid sun exposure in the area of prior treatment for up to one year following radiation with either sunscreen or by the style of clothing worn in the sun.  The patient has scheduled follow up with her medical oncologist Dr. Lanny on 06/09/2024 for ongoing surveillance, and was encouraged to call if she develops concerns or questions regarding radiation.   No vitals were taking for this visit.

## 2024-02-18 ENCOUNTER — Other Ambulatory Visit: Payer: Self-pay

## 2024-02-18 ENCOUNTER — Other Ambulatory Visit: Payer: Self-pay | Admitting: Hematology

## 2024-02-18 MED ORDER — TAMOXIFEN CITRATE 20 MG PO TABS: 20.0000 mg | ORAL_TABLET | Freq: Every day | ORAL | 2 refills | Status: DC

## 2024-03-08 ENCOUNTER — Encounter: Payer: Self-pay | Admitting: Nurse Practitioner

## 2024-03-08 ENCOUNTER — Inpatient Hospital Stay: Admitting: Nurse Practitioner

## 2024-03-08 ENCOUNTER — Inpatient Hospital Stay: Attending: Hematology

## 2024-03-08 VITALS — BP 100/58 | HR 72 | Temp 98.1°F | Resp 17 | Wt 109.2 lb

## 2024-03-08 DIAGNOSIS — D500 Iron deficiency anemia secondary to blood loss (chronic): Secondary | ICD-10-CM | POA: Diagnosis not present

## 2024-03-08 DIAGNOSIS — C50412 Malignant neoplasm of upper-outer quadrant of left female breast: Secondary | ICD-10-CM | POA: Insufficient documentation

## 2024-03-08 DIAGNOSIS — Z79810 Long term (current) use of selective estrogen receptor modulators (SERMs): Secondary | ICD-10-CM | POA: Insufficient documentation

## 2024-03-08 DIAGNOSIS — Z170 Estrogen receptor positive status [ER+]: Secondary | ICD-10-CM | POA: Insufficient documentation

## 2024-03-08 LAB — FERRITIN: Ferritin: 126 ng/mL (ref 11–307)

## 2024-03-08 NOTE — Progress Notes (Signed)
 CLINIC:  Survivorship   Patient Care Team: Austin Mutton, MD as PCP - General (Internal Medicine) Tyree Nanetta SAILOR, RN as Oncology Nurse Navigator Lanny Callander, MD as Consulting Physician (Hematology) Ebbie Cough, MD as Consulting Physician (General Surgery) Daisia Slomski K, NP as Nurse Practitioner (Nurse Practitioner) Dewey Rush, MD as Consulting Physician (Radiation Oncology)   REASON FOR VISIT:  Routine follow-up post-treatment for a recent history of breast cancer.  BRIEF ONCOLOGIC HISTORY:  Oncology History  Malignant neoplasm of upper-outer quadrant of left breast in female, estrogen receptor positive (HCC)  05/24/2022 Surgery   Surgical pathology: Left lumpectomy, tumor size 2.3 x 2.0 x 1.8 cm, invasive ductal carcinoma with mucinous.  Grade 2,  (+) DCIS G2. (+).  Lymphovascular invasion (+), perineural invasion(-).  Surgical margins were negative.  No lymph nodes seen in the surgical sample.  ER 75% positive, moderate to strong staining, PR 20% positive, moderate staining, HER2 2+, HER2 negative by FISH.  Ki-67 40%   05/13/2023 Initial Biopsy   Biopsy of left breast mass: invasive ductal carcinoma,   IHC: ER +90% strong staining, PR 30% positive, moderate staining, HER2 2+, Ki-67 25%, e-cadherin (+)  Left axillary node biopsy: negative for malignant cells    05/21/2023 Imaging   Mammogram showed a irregular 2.2 x 2.0 cm mass in the upper outer quadrant of left breast, 3.3 cm from nipple, with irregular border and calcification, BI-RAD 4b, biopsy recommended.  In the right breast, there is a 1 cm nodule, likely benign, and scattered calcification, likely benign.    05/24/2023 Cancer Staging   Staging form: Breast, AJCC 8th Edition - Pathologic stage from 05/24/2023: Stage IA (pT2, pN0, cM0, G2, ER+, PR+, HER2-) - Signed by Lanny Callander, MD on 06/16/2023 Histologic grading system: 3 grade system Residual tumor (R): R0 - None   06/16/2023 Initial Diagnosis   Malignant neoplasm of  upper-outer quadrant of left breast in female, estrogen receptor positive (HCC)   07/29/2023 -  Chemotherapy   Patient is on Treatment Plan : BREAST TC q21d       INTERVAL HISTORY:  Alexis [name] presents to the Survivorship Clinic today for our initial meeting to review her survivorship care plan detailing her treatment course for breast cancer, as well as monitoring long-term side effects of that treatment, education regarding health maintenance, screening, and overall wellness and health promotion.     Overall, Alexis [name] reports feeling well. She has recovered from breast cancer treatment. Has occasional pain in left breast since surgery. Tolerating tamoxifen . Has occasional fatigue, mood fluctuation, and times where she has to concentrate more on speech/word finding. Has not had menses since chemo. Working with a Psychologist, educational.     REVIEW OF SYSTEMS:  Review of Systems - Oncology Breast: Denies any new nodularity, masses, tenderness, nipple changes, or nipple discharge.      ONCOLOGY TREATMENT TEAM:  1. Surgeon:  Dr. Ebbie at [facility] 2. Medical Oncologist: Dr. Lanny  3. Radiation Oncologist: Dr. Dewey    PAST MEDICAL/SURGICAL HISTORY:  Past Medical History:  Diagnosis Date   Cancer (HCC) 07/10/2023   Breast   Past Surgical History:  Procedure Laterality Date   APPENDECTOMY     BREAST LUMPECTOMY Left 05/24/2023   PORT-A-CATH REMOVAL Right 12/09/2023   Procedure: REMOVAL PORT-A-CATH;  Surgeon: Ebbie Cough, MD;  Location: [facility];  Service: General;  Laterality: Right;   PORTACATH PLACEMENT Right 07/17/2023   Procedure: INSERTION PORT-A-CATH WITH ULTRASOUND GUIDANCE;  Surgeon: Ebbie,  Donnice, MD;  Location: [facility];  Service: General;  Laterality: Right;     ALLERGIES:  Allergies  Allergen Reactions   Apple Swelling    Throat closes     CURRENT MEDICATIONS:  Outpatient Encounter Medications as of 03/08/2024   Medication Sig   loratadine  (CLARITIN ) 10 MG tablet Take 10 mg by mouth daily as needed for allergies.   ondansetron  (ZOFRAN ) 8 MG tablet Take 1 tablet (8 mg total) by mouth every 8 (eight) hours as needed for nausea or vomiting. Start on the third day after chemotherapy.   prochlorperazine  (COMPAZINE ) 10 MG tablet Take 1 tablet (10 mg total) by mouth every 6 (six) hours as needed for nausea or vomiting.   tamoxifen  (NOLVADEX ) 20 MG tablet Take 1 tablet (20 mg total) by mouth daily.   No facility-administered encounter medications on file as of 03/08/2024.     ONCOLOGIC FAMILY HISTORY:  Family History  Problem Relation Age of Onset   Hypertension Mother    Hypertension Father    Cancer Cousin 34       breast cancer     GENETIC COUNSELING/TESTING: Yes, in [place] per pt, negative   SOCIAL HISTORY:  Alexis [name] is married, living in [place], working. Denies tobacco use.    PHYSICAL EXAMINATION:  Vital Signs:   Vitals:   03/08/24 1234  BP: (!) 100/58  Pulse: 72  Resp: 17  Temp: 98.1 F (36.7 C)  SpO2: 99%   Filed Weights   03/08/24 1234  Weight: 109 lb 3.2 oz (49.5 kg)   General: Well-nourished, well-appearing female in no acute distress.  HEENT:  Sclerae anicteric.  Lymph: No cervical, supraclavicular, or infraclavicular lymphadenopathy noted on palpation.  Cardiovascular: Regular rate and rhythm. Respiratory: Clear; breathing non-labored.  GI: Abdomen soft and round; non-tender, non-distended. Bowel sounds normoactive.  Neuro: No focal deficits. Steady gait.  Psych: Mood and affect normal and appropriate for situation.  Extremities: No edema. MSK:  Full range of motion in bilateral upper extremities Skin: Warm and dry. Breast exam: No nipple discharge or inversion.  S/p left lumpectomy, incision completely healed with some discoloration and scar tissue versus seroma.  No palpable mass or nodularity in either breast or axilla that I could  appreciate  LABORATORY DATA:  None for this visit.  DIAGNOSTIC IMAGING:  None for this visit.      ASSESSMENT AND PLAN:  Alexis [name] is a pleasant 43 y.o. female with Stage 1A left breast invasive ductal carcinoma, ER+/PR+/HER2-, diagnosed in 04/2023, treated with lumpectomy, adjuvant chemotherapy, radiation therapy, and anti-estrogen therapy with tamoxifen  beginning in 12/2023.  She presents to the Survivorship Clinic for our initial meeting and routine follow-up post-completion of treatment for breast cancer.   #. Stage IA left breast cancer:  Alexis [name] has recovered well from definitive treatment for breast cancer. She will follow-up with her medical oncologist, Dr. Lanny in 05/2024 with history and physical exam per surveillance protocol.  She will continue her anti-estrogen therapy with tamoxifen . Thus far, she is tolerating well, with minimal side effects. She was instructed to make Dr. Lanny or myself aware if she begins to experience any worsening side effects of the medication and I could see her back in clinic to help manage those side effects, as needed. I will refer her to new ob/gyn per pt request. Today, a comprehensive survivorship care plan and treatment summary was reviewed with the patient today detailing her breast cancer diagnosis, treatment course, potential late/long-term effects  of treatment, appropriate follow-up care with recommendations for the future, and patient education resources.  We reviewed MRD testing with signatera for surveillance as well as screening breast MRI due to her d density. She is open/agreeable to both recommendations. A copy of this summary, along with a letter will be sent to the patient's primary care provider via mail/fax/In Basket message after today's visit.    #. Bone health:  Given Alexis [name] age, perimenopausal status, and tamoxifen  use, she does not need to start DEXA screening yet.  In the meantime, she was encouraged to increase her consumption of  foods rich in calcium, as well as increase her weight-bearing activities.  She was given education on specific activities to promote bone health.  #. Cancer screening:  Due to Alexis [name] history and her age, she should receive screening for skin cancers, colon cancer, and gynecologic cancers.  The information and recommendations are listed on the patient's comprehensive care plan/treatment summary and were reviewed in detail with the patient.    #. Health maintenance and wellness promotion: Alexis [name] was encouraged to consume 5-7 servings of fruits and vegetables per day.  She was also encouraged to continue physical exercise with her trainer. She was instructed to limit her alcohol consumption and continue to abstain from tobacco use.   #. Support services/counseling: It is not uncommon for this period of the patient's cancer care trajectory to be one of many emotions and stressors.  We discussed an opportunity for her to participate in the next session of FYNN (Finding Your New Normal) support group series designed for patients after they have completed treatment.   Alexis [name] was encouraged to take advantage of our many other support services programs, support groups, and/or counseling in coping with her new life as a cancer survivor after completing anti-cancer treatment.  She was offered support today through active listening and expressive supportive counseling.  She was given information regarding our available services and encouraged to contact me with any questions or for help enrolling in any of our support group/programs.    Dispo:   -Return to cancer center in 1-2 weeks for signatera testing, then f/up in 05/2024 as scheduled   -Mammogram due in 03/2024, screening breast MRI 09/2024 (d density) -Follow up with surgery as scheduled -Referral to PT for sozo screening and new ob/gyn per pt request   She is welcome to return back to the Survivorship Clinic at any time; no additional follow-up  needed at this time. Consider referral back to survivorship as a long-term survivor for continued surveillance   Orders Placed This Encounter  Procedures   MM DIAG BREAST TOMO BILATERAL    Standing Status:   Future    Expected Date:   04/13/2024    Expiration Date:   03/08/2025    Reason for Exam (SYMPTOM  OR DIAGNOSIS REQUIRED):   breast cancer 03/2024, d density    Is the patient pregnant?:   No    Preferred [facility]   Ambulatory referral to Gynecology    Referral Priority:   Routine    Referral Type:   Consultation    Referral Reason:   Specialty Services Required    Requested Specialty:   Gynecology    Number of Visits Requested:   1   Ambulatory referral to Physical Therapy    Referral Priority:   Routine    Referral Type:   Physical Medicine    Referral Reason:   Specialty Services  Required    Requested Specialty:   Physical Therapy    Number of Visits Requested:   1    A total of (30) minutes of face-to-face time was spent with this patient with greater than 50% of that time in counseling and care-coordination.   Francesco Provencal, NP Survivorship Program [facility] [phone]   Note: PRIMARY CARE PROVIDER Austin Mutton, MD [phone]

## 2024-03-17 ENCOUNTER — Other Ambulatory Visit: Payer: Self-pay

## 2024-03-17 DIAGNOSIS — D500 Iron deficiency anemia secondary to blood loss (chronic): Secondary | ICD-10-CM

## 2024-03-17 DIAGNOSIS — Z170 Estrogen receptor positive status [ER+]: Secondary | ICD-10-CM

## 2024-03-18 ENCOUNTER — Other Ambulatory Visit: Payer: Self-pay

## 2024-03-18 ENCOUNTER — Inpatient Hospital Stay

## 2024-03-18 DIAGNOSIS — C50412 Malignant neoplasm of upper-outer quadrant of left female breast: Secondary | ICD-10-CM | POA: Diagnosis not present

## 2024-03-18 DIAGNOSIS — Z170 Estrogen receptor positive status [ER+]: Secondary | ICD-10-CM

## 2024-03-18 DIAGNOSIS — D500 Iron deficiency anemia secondary to blood loss (chronic): Secondary | ICD-10-CM

## 2024-03-18 LAB — CMP (CANCER CENTER ONLY)
ALT: 15 U/L (ref 0–44)
AST: 17 U/L (ref 15–41)
Albumin: 4.3 g/dL (ref 3.5–5.0)
Alkaline Phosphatase: 58 U/L (ref 38–126)
Anion gap: 5 (ref 5–15)
BUN: 17 mg/dL (ref 6–20)
CO2: 31 mmol/L (ref 22–32)
Calcium: 9.3 mg/dL (ref 8.9–10.3)
Chloride: 107 mmol/L (ref 98–111)
Creatinine: 0.68 mg/dL (ref 0.44–1.00)
GFR, Estimated: >60 mL/min (ref >60)
Glucose, Bld: 83 mg/dL (ref 70–99)
Potassium: 3.8 mmol/L (ref 3.5–5.1)
Sodium: 143 mmol/L (ref 135–145)
Total Bilirubin: 0.6 mg/dL (ref 0.0–1.2)
Total Protein: 6.7 g/dL (ref 6.5–8.1)

## 2024-03-18 LAB — CBC WITH DIFFERENTIAL (CANCER CENTER ONLY)
Abs Immature Granulocytes: 0 K/uL (ref 0.00–0.07)
Basophils Absolute: 0 K/uL (ref 0.0–0.1)
Basophils Relative: 0 %
Eosinophils Absolute: 0.1 K/uL (ref 0.0–0.5)
Eosinophils Relative: 2 %
HCT: 40.2 % (ref 36.0–46.0)
Hemoglobin: 12.8 g/dL (ref 12.0–15.0)
Immature Granulocytes: 0 %
Lymphocytes Relative: 34 %
Lymphs Abs: 0.9 K/uL (ref 0.7–4.0)
MCH: 21.8 pg — ABNORMAL LOW (ref 26.0–34.0)
MCHC: 31.8 g/dL (ref 30.0–36.0)
MCV: 68.4 fL — ABNORMAL LOW (ref 80.0–100.0)
Monocytes Absolute: 0.2 K/uL (ref 0.1–1.0)
Monocytes Relative: 7 %
Neutro Abs: 1.5 K/uL — ABNORMAL LOW (ref 1.7–7.7)
Neutrophils Relative %: 57 %
Platelet Count: 125 K/uL — ABNORMAL LOW (ref 150–400)
RBC: 5.88 MIL/uL — ABNORMAL HIGH (ref 3.87–5.11)
RDW: 17.5 % — ABNORMAL HIGH (ref 11.5–15.5)
WBC Count: 2.5 K/uL — ABNORMAL LOW (ref 4.0–10.5)
nRBC: 0 % (ref 0.0–0.2)

## 2024-03-18 LAB — PREGNANCY, URINE: Preg Test, Ur: NEGATIVE

## 2024-03-18 LAB — GENETIC SCREENING ORDER

## 2024-03-18 LAB — FERRITIN: Ferritin: 149 ng/mL (ref 11–307)

## 2024-03-18 NOTE — Progress Notes (Signed)
 As per Dr. Lanny, order placed in portal for [employer] successfully with paperwork uploaded. Kit taken to lab to be drawn today.

## 2024-04-01 ENCOUNTER — Inpatient Hospital Stay: Attending: Hematology

## 2024-04-01 ENCOUNTER — Other Ambulatory Visit: Payer: Self-pay

## 2024-04-01 DIAGNOSIS — C50412 Malignant neoplasm of upper-outer quadrant of left female breast: Secondary | ICD-10-CM

## 2024-04-01 DIAGNOSIS — Z79810 Long term (current) use of selective estrogen receptor modulators (SERMs): Secondary | ICD-10-CM | POA: Insufficient documentation

## 2024-04-01 DIAGNOSIS — E559 Vitamin D deficiency, unspecified: Secondary | ICD-10-CM | POA: Insufficient documentation

## 2024-04-01 DIAGNOSIS — Z170 Estrogen receptor positive status [ER+]: Secondary | ICD-10-CM | POA: Diagnosis not present

## 2024-04-01 LAB — CBC WITH DIFFERENTIAL (CANCER CENTER ONLY)
Abs Immature Granulocytes: 0.01 K/uL (ref 0.00–0.07)
Basophils Absolute: 0 K/uL (ref 0.0–0.1)
Basophils Relative: 0 %
Eosinophils Absolute: 0 K/uL (ref 0.0–0.5)
Eosinophils Relative: 1 %
HCT: 40.8 % (ref 36.0–46.0)
Hemoglobin: 12.7 g/dL (ref 12.0–15.0)
Immature Granulocytes: 0 %
Lymphocytes Relative: 16 %
Lymphs Abs: 0.6 K/uL — ABNORMAL LOW (ref 0.7–4.0)
MCH: 21.7 pg — ABNORMAL LOW (ref 26.0–34.0)
MCHC: 31.1 g/dL (ref 30.0–36.0)
MCV: 69.9 fL — ABNORMAL LOW (ref 80.0–100.0)
Monocytes Absolute: 0.2 K/uL (ref 0.1–1.0)
Monocytes Relative: 6 %
Neutro Abs: 2.9 K/uL (ref 1.7–7.7)
Neutrophils Relative %: 77 %
Platelet Count: 125 K/uL — ABNORMAL LOW (ref 150–400)
RBC: 5.84 MIL/uL — ABNORMAL HIGH (ref 3.87–5.11)
RDW: 17.1 % — ABNORMAL HIGH (ref 11.5–15.5)
WBC Count: 3.8 K/uL — ABNORMAL LOW (ref 4.0–10.5)
nRBC: 0 % (ref 0.0–0.2)

## 2024-04-01 LAB — CMP (CANCER CENTER ONLY)
ALT: 17 U/L (ref 0–44)
AST: 19 U/L (ref 15–41)
Albumin: 4.2 g/dL (ref 3.5–5.0)
Alkaline Phosphatase: 61 U/L (ref 38–126)
Anion gap: 5 (ref 5–15)
BUN: 18 mg/dL (ref 6–20)
CO2: 29 mmol/L (ref 22–32)
Calcium: 9.2 mg/dL (ref 8.9–10.3)
Chloride: 107 mmol/L (ref 98–111)
Creatinine: 0.59 mg/dL (ref 0.44–1.00)
GFR, Estimated: >60 mL/min (ref >60)
Glucose, Bld: 86 mg/dL (ref 70–99)
Potassium: 4.1 mmol/L (ref 3.5–5.1)
Sodium: 141 mmol/L (ref 135–145)
Total Bilirubin: 0.4 mg/dL (ref 0.0–1.2)
Total Protein: 6.4 g/dL — ABNORMAL LOW (ref 6.5–8.1)

## 2024-04-01 LAB — VITAMIN D 25 HYDROXY (VIT D DEFICIENCY, FRACTURES): Vit D, 25-Hydroxy: 22.07 ng/mL — ABNORMAL LOW (ref 30–100)

## 2024-04-01 LAB — VITAMIN B12: Vitamin B-12: 650 pg/mL (ref 180–914)

## 2024-04-05 ENCOUNTER — Other Ambulatory Visit: Payer: Self-pay

## 2024-04-05 DIAGNOSIS — C50412 Malignant neoplasm of upper-outer quadrant of left female breast: Secondary | ICD-10-CM

## 2024-04-05 DIAGNOSIS — N838 Other noninflammatory disorders of ovary, fallopian tube and broad ligament: Secondary | ICD-10-CM

## 2024-04-06 ENCOUNTER — Inpatient Hospital Stay

## 2024-04-06 DIAGNOSIS — C50412 Malignant neoplasm of upper-outer quadrant of left female breast: Secondary | ICD-10-CM

## 2024-04-06 DIAGNOSIS — N838 Other noninflammatory disorders of ovary, fallopian tube and broad ligament: Secondary | ICD-10-CM

## 2024-04-06 LAB — GUARDANT REVEAL

## 2024-04-13 ENCOUNTER — Encounter: Payer: Self-pay | Admitting: Hematology

## 2024-04-13 ENCOUNTER — Ambulatory Visit
Admission: RE | Admit: 2024-04-13 | Discharge: 2024-04-13 | Disposition: A | Source: Ambulatory Visit | Attending: Nurse Practitioner

## 2024-04-13 DIAGNOSIS — C50412 Malignant neoplasm of upper-outer quadrant of left female breast: Secondary | ICD-10-CM

## 2024-04-13 HISTORY — DX: Malignant neoplasm of unspecified site of unspecified female breast: C50.919

## 2024-04-20 ENCOUNTER — Other Ambulatory Visit: Payer: Self-pay

## 2024-04-28 NOTE — Assessment & Plan Note (Signed)
° °  pT2N0M0, stage IA, ER+/PR+/HER2- 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 Underwent lumpectomy on May 24, 2023. Tumor size 2.4 cm. Sentinel lymph node biopsy attempted but no sentinel lymph nodes found. Family history of breast cancer in a cousin.  -Her biopsy slides for stains and reviewed in our pathology lab, which confirmed invasive ductal carcinoma, and a DCIS.  Grade 2.  ER 40% with moderate staining, PR 40% moderate to strong staining, HER2 (-). 2 SLN were negative  -Oncotype RS 33, with 21% predicted risk of distant recurrence with tamoxifen  in the next 9 years.  Adjuvant chemotherapy is recommended -I also discussed the clinical trial option with her  -She has met our surgeon Dr. Ebbie and had port placed  -she started adjuvant chemotherapy TC every 3 weeks for 4 cycles on 07/28/2023 and completed on 09/30/2023 -she started adjuvant RT on 11/05/2023 and completed on 12/04/2023 -she started Tamoxifen  in August 2025. We will discuss adjuvant ribociclib.

## 2024-04-29 ENCOUNTER — Inpatient Hospital Stay

## 2024-04-29 ENCOUNTER — Other Ambulatory Visit: Payer: Self-pay

## 2024-04-29 ENCOUNTER — Inpatient Hospital Stay: Attending: Hematology | Admitting: Hematology

## 2024-04-29 ENCOUNTER — Inpatient Hospital Stay: Admitting: Hematology

## 2024-04-29 VITALS — BP 92/60 | HR 75 | Temp 96.7°F | Resp 17 | Wt 111.8 lb

## 2024-04-29 DIAGNOSIS — Z170 Estrogen receptor positive status [ER+]: Secondary | ICD-10-CM

## 2024-04-29 DIAGNOSIS — E559 Vitamin D deficiency, unspecified: Secondary | ICD-10-CM | POA: Diagnosis not present

## 2024-04-29 DIAGNOSIS — C50412 Malignant neoplasm of upper-outer quadrant of left female breast: Secondary | ICD-10-CM | POA: Diagnosis not present

## 2024-04-29 DIAGNOSIS — D563 Thalassemia minor: Secondary | ICD-10-CM | POA: Insufficient documentation

## 2024-04-29 DIAGNOSIS — Z79810 Long term (current) use of selective estrogen receptor modulators (SERMs): Secondary | ICD-10-CM | POA: Diagnosis not present

## 2024-04-29 NOTE — Progress Notes (Signed)
 [facility]   Telephone:([phone]) [phone] Fax:([phone]) [phone]   Clinic Follow up Note   Patient Care Team: Austin Mutton, MD as PCP - General (Internal Medicine) Tyree Nanetta SAILOR, RN as Oncology Nurse Navigator Lanny Callander, MD as Consulting Physician (Hematology) Ebbie Cough, MD as Consulting Physician (General Surgery) Burton, Lacie K, NP as Nurse Practitioner (Nurse Practitioner) Dewey Rush, MD as Consulting Physician (Radiation Oncology)  Date of Service:  04/29/2024  CHIEF COMPLAINT: f/u of left breast cancer  CURRENT THERAPY:  Adjuvant tamoxifen  20 mg daily  Oncology History   Malignant neoplasm of upper-outer quadrant of left breast in female, estrogen receptor positive (HCC) pT2N0M0, stage IA, ER+/PR+/HER2-, Oncotype RS 33 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 Underwent lumpectomy on May 24, 2023. Tumor size 2.4 cm. Sentinel lymph node biopsy attempted but no sentinel lymph nodes found. Family history of breast cancer in a cousin.  -Her biopsy slides for stains and reviewed in our pathology lab, which confirmed invasive ductal carcinoma, and a DCIS.  Grade 2.  ER 40% with moderate staining, PR 40% moderate to strong staining, HER2 (-). 2 SLN were negative  -Oncotype RS 33, with 21% predicted risk of distant recurrence with tamoxifen  in the next 9 years.  Adjuvant chemotherapy is recommended -she started adjuvant chemotherapy TC every 3 weeks for 4 cycles on 07/28/2023 and completed on 09/30/2023 -she started adjuvant RT on 11/05/2023 and completed on 12/04/2023 -she started Tamoxifen  in August 2025 and tolerates well. We discussed adjuvant AI and ribociclib.  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  She has been on tamoxifen  since July 2025 for endocrine therapy. She is at intermediate to high risk for recurrence.  -she has not had a menstrual period since chemotherapy, but is probably still premenopausal, possibly with chemotherapy-induced ovarian suppression. She tolerates tamoxifen  well, experiencing only mild, manageable hot flashes and mild afternoon fatigue. No evidence of disease recurrence.  - Plan to check [facility] and estradiol level on next visit, to see if she is truly postmenopausal.   -Risks and benefits of ongoing endocrine therapy, ovarian suppression, and adjuvant CDK4/6 inhibitor were discussed, including modest benefit (reduce risk of recurrence by 3 to 6%) and potential adverse effects (bone density loss, joint pain). Menopausal status and bone health are important considerations. - Continued tamoxifen  therapy. - Planned assessment of menopausal status with laboratory evaluation at next visit.  If she is postmenopausal, will change tamoxifen  to AI, and may consider adding ribociclib at that time. - Reviewed option of ovarian suppression (injection or surgery) to facilitate aromatase inhibitor use if not menopausal; will consider based on tolerance and preference. - Discussed FDA-approved adjuvant CDK4/6 inhibitor (Kisqali) for high-risk ER+ breast cancer; benefit is modest (3-4% absolute reduction in recurrence risk), limited impact on overall survival; will revisit after further risk assessment and patient consideration. - Reviewed risks of tamoxifen  (thrombosis, endometrial cancer) and aromatase inhibitor (bone density loss, arthralgia); will monitor and provide anticipatory guidance. - Encouraged weight-bearing exercise and vitamin D  supplementation for bone health. - Scheduled follow-up in 4-5 months with repeat laboratory evaluation and reassessment of menopausal status and bone health.  Vitamin D  deficiency Documented vitamin D  deficiency (recent level 22 ng/mL), likely  multifactorial due to limited sun exposure, Asian ethnicity, and prior chemotherapy. She recently initiated vitamin D3 5000  IU daily without adverse effects. Ongoing monitoring is indicated, especially during winter. - Continued vitamin D3 supplementation at 5000 IU daily for 3-6 months. - Planned re-evaluation of vitamin D  level in 4-5 months. - Monitored for hypervitaminosis D; current dosing appropriate for deficiency. - Encouraged weight-bearing exercise for bone health. - Continued current regimen including vitamin D  and vitamin K3; no contraindication to concurrent use.  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 She is asymptomatic and not anemic. She is aware of inheritance risk for offspring and no contraindication to vitamin D  or multivitamin use. - No specific therapy required. - Recommended prenatal or multivitamin supplementation to support erythropoiesis. - Advised that thalassemia minor may be inherited by offspring; suggested her child be tested for red cell indices in adulthood. - No contraindication to concurrent vitamin D  and multivitamin use.  Leukopenia post-chemotherapy Mild persistent leukopenia following chemotherapy, with normalized neutrophil count and mild lymphopenia. This increases susceptibility to viral infections but no evidence of severe immunosuppression or neutropenia. She reports increased susceptibility to viral infections at home. - Advised increased vigilance for viral infections, especially during winter. - Recommended annual influenza and other indicated vaccinations. - No current intervention required; anticipate gradual lymphocyte recovery.  Plan - He is overall tolerating tamoxifen  well, will continue - Plan to check [facility] and estradiol level on her next visit, to see if she is postmenopausal.  If she is, plan to change tamoxifen  to AI. -  Follow-up in 5 months with lab 1 week before   SUMMARY OF ONCOLOGIC HISTORY: Oncology History  Malignant neoplasm of upper-outer quadrant of left breast in female, estrogen receptor positive (HCC)  05/24/2022 Surgery   Surgical pathology: Left lumpectomy, tumor size 2.3 x 2.0 x 1.8 cm, invasive ductal carcinoma with mucinous.  Grade 2,  (+) DCIS G2. (+).  Lymphovascular invasion (+), perineural invasion(-).  Surgical margins were negative.  No lymph nodes seen in the surgical sample.  ER 75% positive, moderate to strong staining, PR 20% positive, moderate staining, HER2 2+, HER2 negative by FISH.  Ki-67 40%   05/13/2023 Initial Biopsy   Biopsy of left breast mass: invasive ductal carcinoma,   IHC: ER +90% strong staining, PR 30% positive, moderate staining, HER2 2+, Ki-67 25%, e-cadherin (+)  Left axillary node biopsy: negative for malignant cells    05/21/2023 Imaging   Mammogram showed a irregular 2.2 x 2.0 cm mass in the upper outer quadrant of left breast, 3.3 cm from nipple, with irregular border and calcification, BI-RAD 4b, biopsy recommended.  In the right breast, there is a 1 cm nodule, likely benign, and scattered calcification, likely benign.    05/24/2023 Cancer Staging   Staging form: Breast, AJCC 8th Edition - Pathologic stage from 05/24/2023: Stage IA (pT2, pN0, cM0, G2, ER+, PR+, HER2-) - Signed by Lanny Callander, MD on 06/16/2023 Histologic grading system: 3 grade system Residual tumor (R): R0 - None   06/16/2023 Initial Diagnosis   Malignant neoplasm of upper-outer quadrant of left breast in female, estrogen receptor positive (HCC)   07/29/2023 -  Chemotherapy   Patient is on Treatment Plan : BREAST TC q21d        Discussed the use of AI scribe software for clinical note transcription with the patient, who gave verbal consent to proceed.  History of Present Illness Alexis [name] is a 43 year old female with stage 2A estrogen receptor-positive invasive ductal carcinoma of the left  breast, status post surgical  resection, adjuvant chemotherapy, and radiation, presenting for routine oncology follow-up to assess ongoing endocrine therapy and treatment-related effects.  She has been on adjuvant tamoxifen  since July 2025 with mild hot flashes and occasional afternoon fatigue that are tolerable. She denies severe vasomotor symptoms, neuropathy, or other significant side effects and has no symptoms concerning for recurrence. Menses have been absent since chemotherapy and starting tamoxifen . Her weight is stable and hair has regrown post-chemotherapy.  She has vitamin D  deficiency with a level of 22 ng/mL in November 2025. She started vitamin D3 5000 IU daily one week ago and also takes vitamin K3 without adverse effects. She is aware of the need for monitoring, especially in winter.  She feels more susceptible to viral infections with household exposures since chemotherapy. She is up to date on vaccinations. Her prior anemia has resolved. She has a family history of thalassemia and takes prenatal or multivitamins as needed.  She performs weight-bearing exercise about three times per week and at home to support bone health during endocrine therapy. She is aware of her osteoporosis risk and is proactive with bone health measures.     All other systems were reviewed with the patient and are negative.  MEDICAL HISTORY:  Past Medical History:  Diagnosis Date   Breast cancer (HCC)    Cancer (HCC) 07/10/2023   Breast    SURGICAL HISTORY: Past Surgical History:  Procedure Laterality Date   APPENDECTOMY     BREAST LUMPECTOMY Left 05/24/2023   PORT-A-CATH REMOVAL Right 12/09/2023   Procedure: REMOVAL PORT-A-CATH;  Surgeon: Ebbie Cough, MD;  Location: [facility];  Service: General;  Laterality: Right;   PORTACATH PLACEMENT Right 07/17/2023   Procedure: INSERTION PORT-A-CATH WITH ULTRASOUND GUIDANCE;  Surgeon: Ebbie Cough, MD;  Location: [facility];  Service: General;  Laterality: Right;    I have reviewed the social history and family history with the patient and they are unchanged from previous note.  ALLERGIES:  is allergic to apple.  MEDICATIONS:  Current Outpatient Medications  Medication Sig Dispense Refill   loratadine  (CLARITIN ) 10 MG tablet Take 10 mg by mouth daily as needed for allergies.     ondansetron  (ZOFRAN ) 8 MG tablet Take 1 tablet (8 mg total) by mouth every 8 (eight) hours as needed for nausea or vomiting. Start on the third day after chemotherapy. 30 tablet 1   prochlorperazine  (COMPAZINE ) 10 MG tablet Take 1 tablet (10 mg total) by mouth every 6 (six) hours as needed for nausea or vomiting. 30 tablet 1   tamoxifen  (NOLVADEX ) 20 MG tablet Take 1 tablet (20 mg total) by mouth daily. 30 tablet 2   No current facility-administered medications for this visit.    PHYSICAL EXAMINATION: ECOG PERFORMANCE STATUS: 0 - Asymptomatic  Vitals:   04/29/24 0900 04/29/24 0901  BP: (!) 90/46 92/60  Pulse:    Resp:    Temp:    SpO2:     Wt Readings from Last 3 Encounters:  04/29/24 111 lb 12.8 oz (50.7 kg)  03/08/24 109 lb 3.2 oz (49.5 kg)  12/09/23 109 lb 5.6 oz (49.6 kg)     GENERAL:alert, no distress and comfortable SKIN: skin color, texture, turgor are normal, no rashes or significant lesions EYES: normal, Conjunctiva are pink and non-injected, sclera clear Musculoskeletal:no cyanosis of digits and no clubbing  NEURO: alert & oriented x 3 with fluent speech, no focal motor/sensory deficits  Physical Exam   LABORATORY DATA:  I have reviewed the data  as listed    Latest Ref Rng & Units 04/01/2024   10:05 AM 03/18/2024   10:06 AM 09/30/2023    9:27 AM  CBC  WBC 4.0 - 10.5 K/uL 3.8  2.5  11.5   Hemoglobin 12.0 - 15.0 g/dL 87.2  87.1  89.6   Hematocrit 36.0 - 46.0 % 40.8  40.2  33.6   Platelets 150 - 400 K/uL 125  125  166         Latest Ref Rng & Units 04/01/2024   10:05 AM 03/18/2024    10:06 AM 09/30/2023    9:27 AM  CMP  Glucose 70 - 99 mg/dL 86  83  821   BUN 6 - 20 mg/dL 18  17  13    Creatinine 0.44 - 1.00 mg/dL 9.40  9.31  9.49   Sodium 135 - 145 mmol/L 141  143  141   Potassium 3.5 - 5.1 mmol/L 4.1  3.8  3.8   Chloride 98 - 111 mmol/L 107  107  106   CO2 22 - 32 mmol/L 29  31  27    Calcium 8.9 - 10.3 mg/dL 9.2  9.3  9.3   Total Protein 6.5 - 8.1 g/dL 6.4  6.7  6.5   Total Bilirubin 0.0 - 1.2 mg/dL 0.4  0.6  0.4   Alkaline Phos 38 - 126 U/L 61  58  76   AST 15 - 41 U/L 19  17  15    ALT 0 - 44 U/L 17  15  19        RADIOGRAPHIC STUDIES: I have personally reviewed the radiological images as listed and agreed with the findings in the report. No results found.    No orders of the defined types were placed in this encounter.  All questions were answered. The patient knows to call the clinic with any problems, questions or concerns. No barriers to learning was detected. The total time spent in the appointment was 40 minutes, including review of chart and various tests results, discussions about plan of care and coordination of care plan     Onita Mattock, MD 04/29/2024

## 2024-05-01 ENCOUNTER — Other Ambulatory Visit: Payer: Self-pay

## 2024-05-04 ENCOUNTER — Other Ambulatory Visit: Payer: Self-pay

## 2024-05-04 NOTE — Progress Notes (Signed)
 As per Dr. Lanny, called patient to relay results of her Guardant Reveal, her results were negative, patient had no questions  at this time and voiced full understanding.

## 2024-05-06 ENCOUNTER — Other Ambulatory Visit: Payer: Self-pay | Admitting: Internal Medicine

## 2024-05-06 DIAGNOSIS — C50012 Malignant neoplasm of nipple and areola, left female breast: Secondary | ICD-10-CM

## 2024-05-27 ENCOUNTER — Encounter: Payer: Self-pay | Admitting: Hematology

## 2024-05-30 ENCOUNTER — Other Ambulatory Visit: Payer: Self-pay

## 2024-05-30 ENCOUNTER — Other Ambulatory Visit

## 2024-05-30 MED ORDER — TAMOXIFEN CITRATE 20 MG PO TABS: 20.0000 mg | ORAL_TABLET | Freq: Every day | ORAL | 2 refills | Status: AC

## 2024-06-08 NOTE — Assessment & Plan Note (Signed)
° °  pT2N0M0, stage IA, ER+/PR+/HER2- 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 Underwent lumpectomy on May 24, 2023. Tumor size 2.4 cm. Sentinel lymph node biopsy attempted but no sentinel lymph nodes found. Family history of breast cancer in a cousin.  -Her biopsy slides for stains and reviewed in our pathology lab, which confirmed invasive ductal carcinoma, and a DCIS.  Grade 2.  ER 40% with moderate staining, PR 40% moderate to strong staining, HER2 (-). 2 SLN were negative  -Oncotype RS 33, with 21% predicted risk of distant recurrence with tamoxifen  in the next 9 years.  Adjuvant chemotherapy is recommended -I also discussed the clinical trial option with her  -She has met our surgeon Dr. Ebbie and had port placed  -she started adjuvant chemotherapy TC every 3 weeks for 4 cycles on 07/28/2023 and completed on 09/30/2023 -she started adjuvant RT on 11/05/2023 and completed on 12/04/2023 -she started Tamoxifen  in August 2025. We will discuss adjuvant ribociclib.

## 2024-06-09 ENCOUNTER — Inpatient Hospital Stay: Admitting: Hematology

## 2024-06-09 ENCOUNTER — Inpatient Hospital Stay

## 2024-06-09 DIAGNOSIS — C50412 Malignant neoplasm of upper-outer quadrant of left female breast: Secondary | ICD-10-CM

## 2024-09-20 ENCOUNTER — Inpatient Hospital Stay

## 2024-09-29 ENCOUNTER — Inpatient Hospital Stay: Admitting: Hematology
